# Patient Record
Sex: Male | Born: 1950 | Race: Black or African American | Hispanic: Refuse to answer | Marital: Married | Smoking: Current some day smoker
Health system: Southern US, Community
[De-identification: ages and names within clinical notes are randomized; demographics above are authoritative.]

## PROBLEM LIST (undated history)

## (undated) DIAGNOSIS — E785 Hyperlipidemia, unspecified: Secondary | ICD-10-CM

## (undated) DIAGNOSIS — G5600 Carpal tunnel syndrome, unspecified upper limb: Secondary | ICD-10-CM

## (undated) HISTORY — DX: Hyperlipidemia, unspecified: E78.5

## (undated) HISTORY — DX: Carpal tunnel syndrome, unspecified upper limb: G56.00

## (undated) HISTORY — PX: OTHER SURGICAL HISTORY: SHX169

## (undated) HISTORY — PX: TONSILLECTOMY: SUR1361

---

## 2009-08-18 ENCOUNTER — Encounter: Admission: RE | Admit: 2009-08-18 | Discharge: 2009-08-18 | Payer: Self-pay | Admitting: Neurosurgery

## 2009-09-05 ENCOUNTER — Encounter: Admission: RE | Admit: 2009-09-05 | Discharge: 2009-09-05 | Payer: Self-pay | Admitting: Neurosurgery

## 2010-02-03 ENCOUNTER — Encounter: Admission: RE | Admit: 2010-02-03 | Discharge: 2010-02-03 | Payer: Self-pay | Admitting: Orthopedic Surgery

## 2010-04-01 ENCOUNTER — Ambulatory Visit
Admission: RE | Admit: 2010-04-01 | Discharge: 2010-04-01 | Payer: Self-pay | Source: Home / Self Care | Attending: Orthopedic Surgery | Admitting: Orthopedic Surgery

## 2010-05-04 ENCOUNTER — Encounter: Payer: Self-pay | Admitting: Neurosurgery

## 2010-06-23 LAB — POCT HEMOGLOBIN-HEMACUE: Hemoglobin: 13.2 g/dL (ref 13.0–17.0)

## 2010-07-09 ENCOUNTER — Other Ambulatory Visit: Payer: Self-pay | Admitting: Orthopedic Surgery

## 2010-07-09 DIAGNOSIS — M25512 Pain in left shoulder: Secondary | ICD-10-CM

## 2010-07-14 ENCOUNTER — Ambulatory Visit
Admission: RE | Admit: 2010-07-14 | Discharge: 2010-07-14 | Disposition: A | Discharge status: Home / Self Care | Payer: BC Managed Care – PPO | Source: Ambulatory Visit | Attending: Orthopedic Surgery | Admitting: Orthopedic Surgery

## 2010-07-14 DIAGNOSIS — M25512 Pain in left shoulder: Secondary | ICD-10-CM

## 2011-03-16 ENCOUNTER — Ambulatory Visit
Admission: RE | Admit: 2011-03-16 | Discharge: 2011-03-16 | Disposition: A | Discharge status: Home / Self Care | Payer: BC Managed Care – PPO | Source: Ambulatory Visit | Attending: Family Medicine | Admitting: Family Medicine

## 2011-03-16 ENCOUNTER — Other Ambulatory Visit: Payer: Self-pay | Admitting: Family Medicine

## 2011-03-16 DIAGNOSIS — R1011 Right upper quadrant pain: Secondary | ICD-10-CM

## 2013-01-17 IMAGING — US US ABDOMEN COMPLETE
1 series · 14 of 25 positions shown · non-contrast
Comparison: None.

CLINICAL DATA: Right upper quadrant abdominal pain

COMPLETE ABDOMINAL ULTRASOUND

[Series 1: us abdomen complete · 14 of 91 slices shown]
[im 1/91]
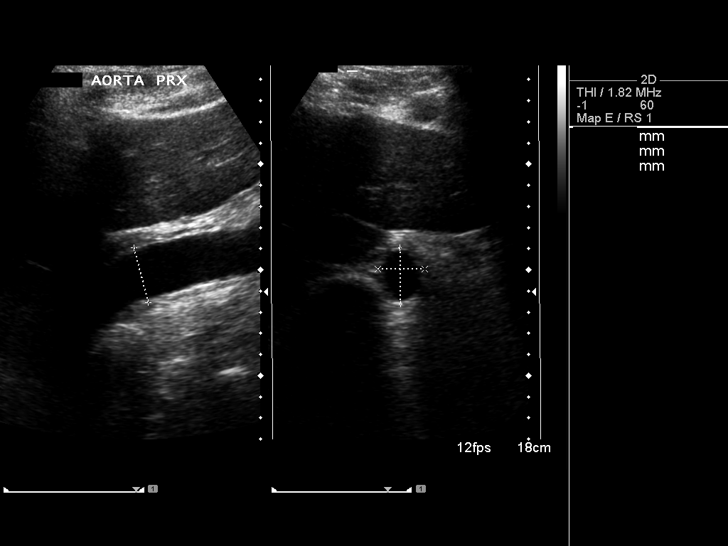
[im 8/91]
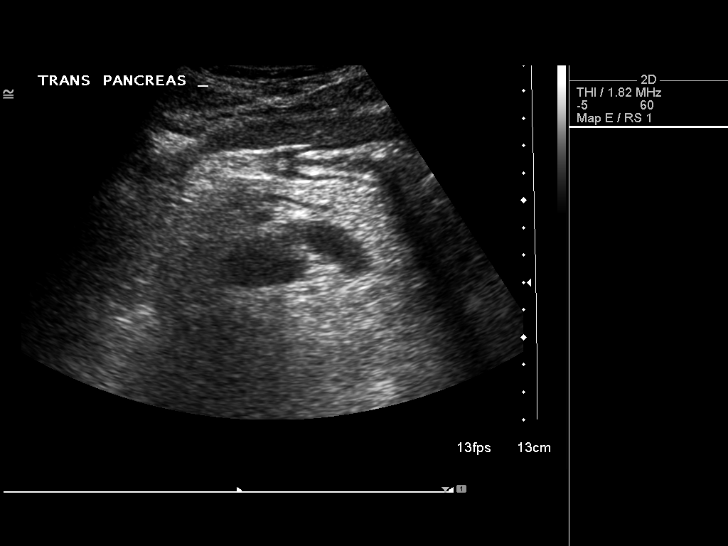
[im 16/91]
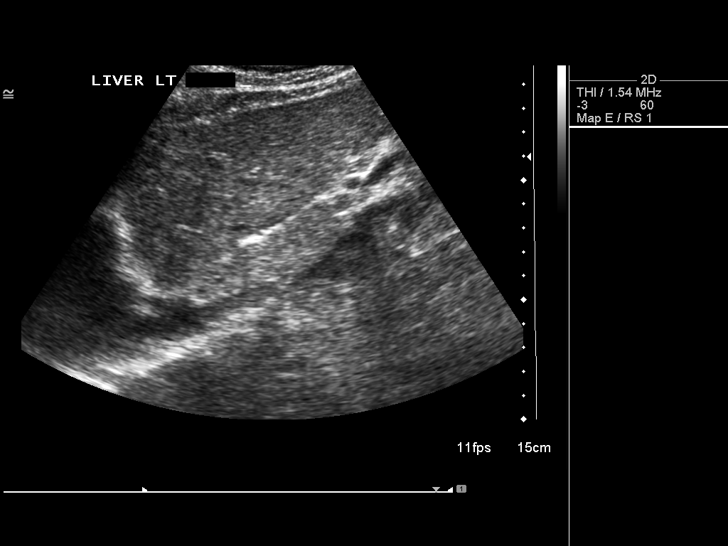
[im 23/91]
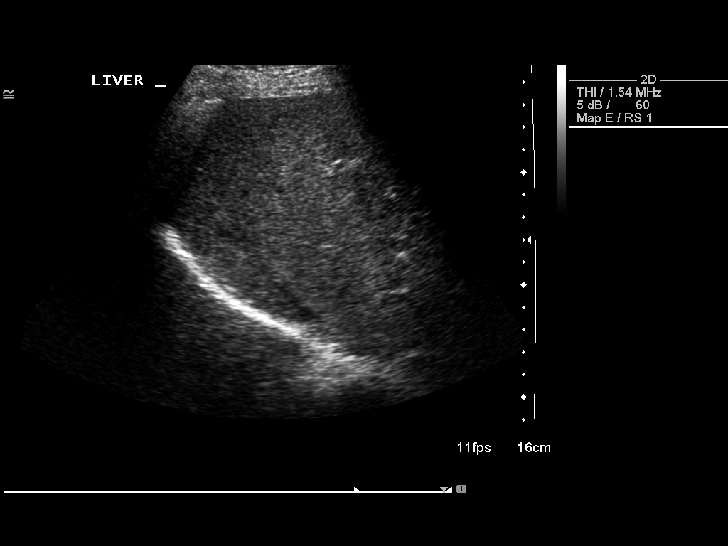
[im 31/91]
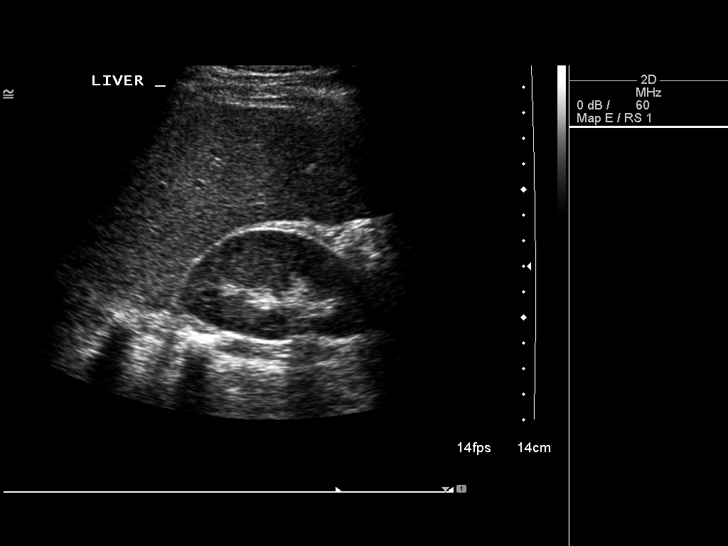
[im 34/91]
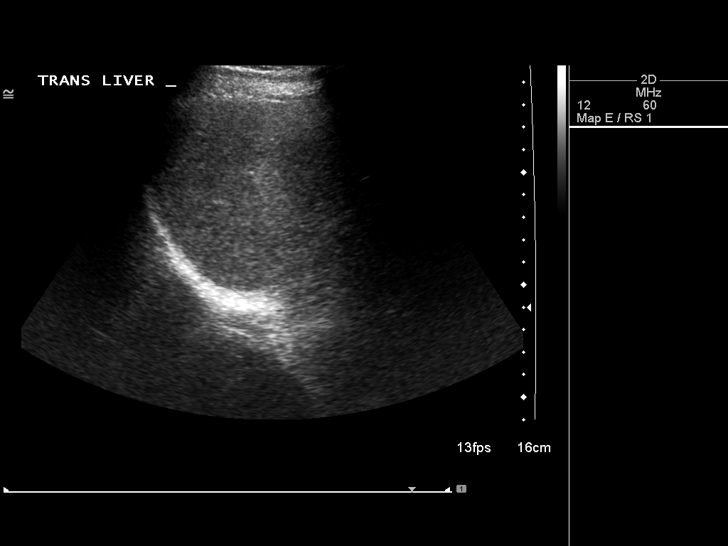
[im 42/91]
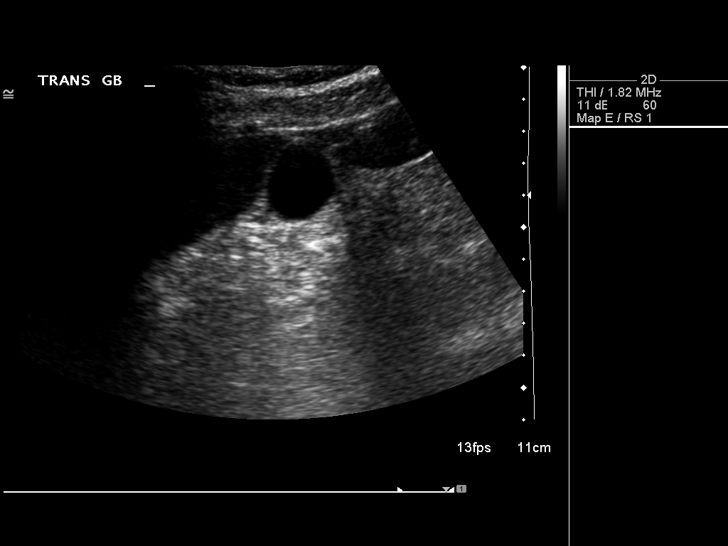
[im 49/91]
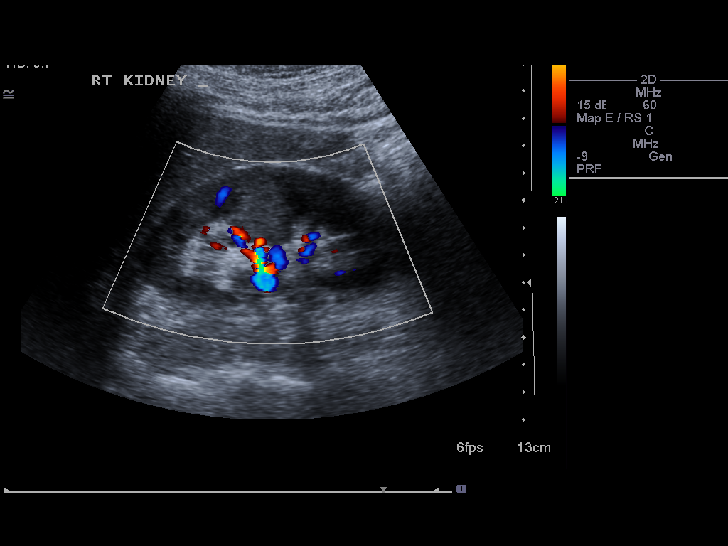
[im 57/91]
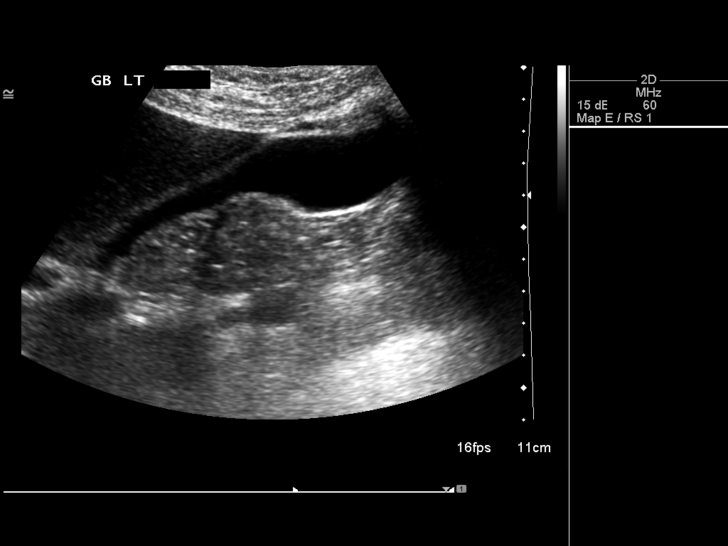
[im 61/91]
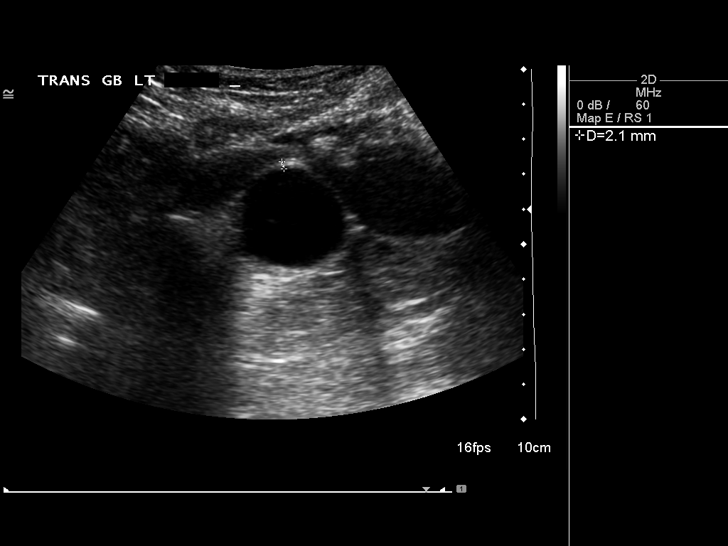
[im 68/91]
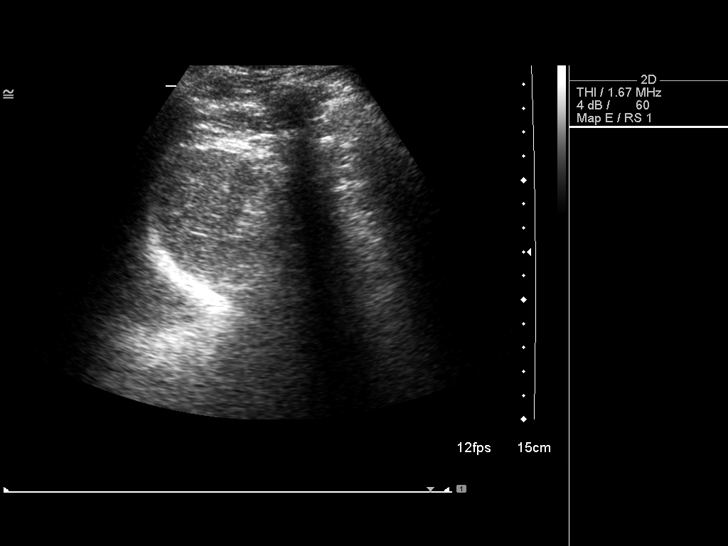
[im 76/91]
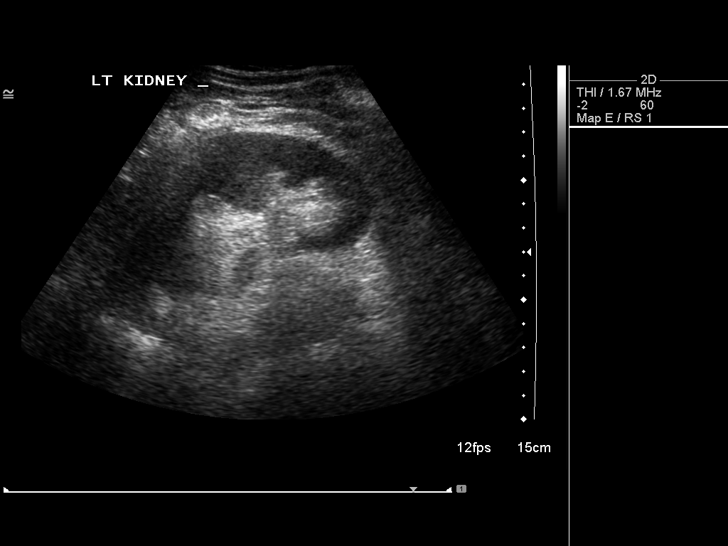
[im 83/91]
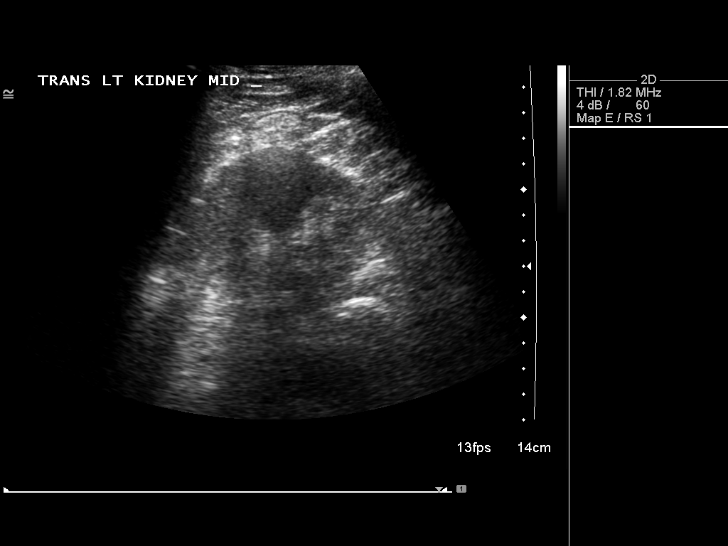
[im 91/91]
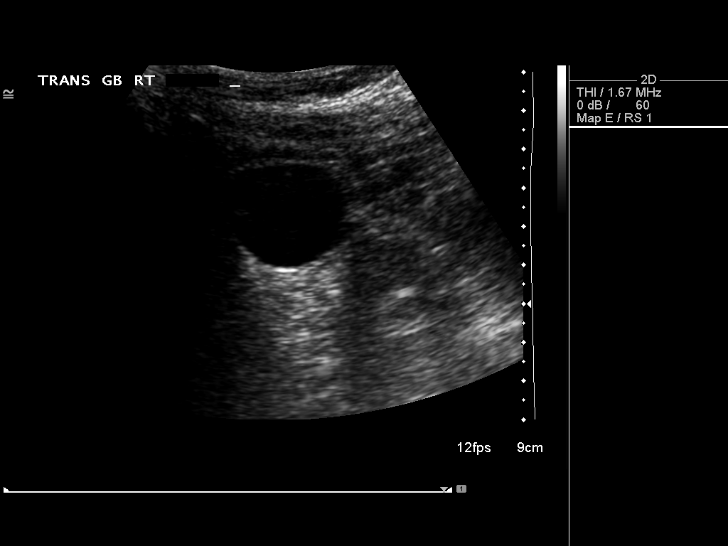

[14 of 25 positions shown; findings below may reference images not displayed]

FINDINGS: Gallbladder:  The gallbladder is visualized and no gallstones are
noted.  There is no pain over the gallbladder with compression.

Common bile duct:  The common bile duct is normal measuring 21.9 mm
in diameter.

Liver:  The liver is slightly prominent measuring 17 cm sagittally.
No hepatic lesion is seen.

IVC:  Appears normal.

Pancreas:  Portions of the head and tail of the pancreas are
obscured by bowel gas.

Spleen:  The spleen is normal measuring 4.4 cm sagittally.

Right Kidney:  No hydronephrosis is seen.  The right kidney
measures 9.7 cm sagittally.

Left Kidney:  No hydronephrosis.  The left kidney measures 9.5 cm.

Abdominal aorta:  The abdominal aorta is normal in caliber with
atheromatous change present.
IMPRESSION: 1.  No gallstones.  No ductal dilatation.
2.  Mild hepatomegaly.
3.  Portions of the head and tail of the pancreas are obscured.

## 2016-06-15 ENCOUNTER — Ambulatory Visit (INDEPENDENT_AMBULATORY_CARE_PROVIDER_SITE_OTHER): Payer: Commercial Managed Care - HMO | Admitting: Orthopaedic Surgery

## 2016-06-15 ENCOUNTER — Ambulatory Visit (INDEPENDENT_AMBULATORY_CARE_PROVIDER_SITE_OTHER): Payer: Commercial Managed Care - HMO

## 2016-06-15 ENCOUNTER — Encounter (INDEPENDENT_AMBULATORY_CARE_PROVIDER_SITE_OTHER): Payer: Self-pay | Admitting: Orthopaedic Surgery

## 2016-06-15 ENCOUNTER — Encounter (INDEPENDENT_AMBULATORY_CARE_PROVIDER_SITE_OTHER): Payer: Self-pay

## 2016-06-15 DIAGNOSIS — G8929 Other chronic pain: Secondary | ICD-10-CM

## 2016-06-15 DIAGNOSIS — M25561 Pain in right knee: Secondary | ICD-10-CM

## 2016-06-15 DIAGNOSIS — M1711 Unilateral primary osteoarthritis, right knee: Secondary | ICD-10-CM | POA: Diagnosis not present
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
## 2016-06-17 ENCOUNTER — Telehealth (INDEPENDENT_AMBULATORY_CARE_PROVIDER_SITE_OTHER): Payer: Self-pay | Admitting: *Deleted

## 2016-06-17 NOTE — Telephone Encounter (Signed)
Tried to call [facility] back but they are closed for lunch from 1-2 will try again later.

## 2016-06-17 NOTE — Telephone Encounter (Signed)
Ingrid calling from [facility] asking if a notes fax was received. CB:[phone]

## 2016-06-18 NOTE — Telephone Encounter (Signed)
Spoke to [facility] and I advised her not sure which [facility]. She said she will send a message to Jerene Dillingngrid to see what fax was faxed over to us. I have not received anything.

## 2017-02-02 ENCOUNTER — Encounter (INDEPENDENT_AMBULATORY_CARE_PROVIDER_SITE_OTHER): Payer: Self-pay | Admitting: Orthopaedic Surgery

## 2017-02-02 ENCOUNTER — Ambulatory Visit (INDEPENDENT_AMBULATORY_CARE_PROVIDER_SITE_OTHER): Payer: Commercial Managed Care - HMO | Admitting: Orthopaedic Surgery

## 2017-02-02 DIAGNOSIS — M1711 Unilateral primary osteoarthritis, right knee: Secondary | ICD-10-CM | POA: Diagnosis not present

## 2017-02-02 NOTE — Progress Notes (Signed)
   Office Visit Note   Patient: Nicholas Bailey           Date of Birth: 05/06/1950           MRN: [id number] Visit Date: 02/02/2017              Requested by: Fleet ContrasAvbuere, Edwin, MD [address] [place], [place] [id number] PCP: Fleet ContrasAvbuere, Edwin, MD   Assessment & Plan: Visit Diagnoses:  1. Primary osteoarthritis of right knee     Plan: Patient has advanced lateral compartment degenerative joint disease status post total patellectomy From a previous injury in the 1970s. at this point we will submit preapproval for Monovisc injection.  Follow-up for the injection.  Follow-Up Instructions: Return if symptoms worsen or fail to improve.   Orders:  No orders of the defined types were placed in this encounter.  No orders of the defined types were placed in this encounter.     Procedures: No procedures performed   Clinical Data: No additional findings.   Subjective: Chief Complaint  Patient presents with  . Right Knee - Pain    Patient follows up today for his right knee osteoarthritis.  He had a temporary and partial relief from previous cortisone injection.  He states that he continues to have throbbing pain that is worse at night.    Review of Systems  Constitutional: Negative.   All other systems reviewed and are negative.    Objective: Vital Signs: There were no vitals taken for this visit.  Physical Exam  Constitutional: He is oriented to person, place, and time. He appears well-developed and well-nourished.  Pulmonary/Chest: Effort normal.  Abdominal: Soft.  Neurological: He is alert and oriented to person, place, and time.  Skin: Skin is warm.  Psychiatric: He has a normal mood and affect. His behavior is normal. Judgment and thought content normal.  Nursing note and vitals reviewed.   Ortho Exam Right knee exam shows fully healed surgical scar.  Exam is stable.  He has good extensor function patient has Specialty Comments:  No specialty comments  available.  Imaging: No results found.   PMFS History: Patient Active Problem List   Diagnosis Date Noted  . Primary osteoarthritis of right knee 06/15/2016   No past medical history on file.  No family history on file.  No past surgical history on file. Social History   Occupational History  . Not on file.   Social History Main Topics  . Smoking status: Former Games developermoker  . Smokeless tobacco: Never Used  . Alcohol use Not on file  . Drug use: Unknown  . Sexual activity: Not on file

## 2017-02-09 ENCOUNTER — Telehealth (INDEPENDENT_AMBULATORY_CARE_PROVIDER_SITE_OTHER): Payer: Self-pay | Admitting: Orthopaedic Surgery

## 2017-02-09 NOTE — Telephone Encounter (Signed)
Patient called asking about the out of pocket expenses for his possible surgery. CB # [phone]

## 2017-02-16 ENCOUNTER — Telehealth (INDEPENDENT_AMBULATORY_CARE_PROVIDER_SITE_OTHER): Payer: Self-pay | Admitting: Radiology

## 2017-02-16 NOTE — Telephone Encounter (Signed)
Per Morrison Oldisha  Patient Nicholas Bailey which seen Dr. Roda ShuttersXu on 02/02/17 is questioning the out of pocket for inj procedure but, wasnt sure what inj for other then knee.  Has Medicare [employer] insurance DOB 08/31/1950. [id number] Cell ph# [phone] said wanting a call back about this had called last Thursday and left message with [facility] about.  Thx Tisha

## 2017-02-16 NOTE — Telephone Encounter (Signed)
Emailed from [facility], can you please call patient to advise on the Monovisc?   Chipper OmanZawatski, Tisha [10:11 AM]:  Merla Ric[facility]Ladies  [facility] call today [facility] regarding a return call from his appt., 02/02/17 with Dr. Roda ShuttersXu wanting to know what the out of pocket is going to be for knee injection. His Cell is [phone] which he can be reach at today. DOB 11/01/1950 has Medicare [employer] insurance and acct [id number].Thx Tisha

## 2017-02-17 NOTE — Telephone Encounter (Signed)
Checked portal and something went wrong needed more info which I submitted will wait for fax copy to get back

## 2017-02-19 NOTE — Telephone Encounter (Signed)
Patient called again to check on the out of pocket max for inj.

## 2017-02-22 NOTE — Telephone Encounter (Signed)
Called to let him know this is still pending. I requested medical benefits (monovisc) and got the fax today. Showed Toniann FailWendy and she will follow up on this.

## 2017-03-02 ENCOUNTER — Telehealth (INDEPENDENT_AMBULATORY_CARE_PROVIDER_SITE_OTHER): Payer: Self-pay | Admitting: Orthopaedic Surgery

## 2017-03-02 NOTE — Telephone Encounter (Signed)
error 

## 2017-03-02 NOTE — Telephone Encounter (Signed)
Patient checking on status of his insurances out of pocket max for knee injection.

## 2017-03-02 NOTE — Telephone Encounter (Signed)
Remember Leonette MonarchGaston emailed us about patient are we going to get something Via fax to have or just go ahead and give patient inj?

## 2017-03-03 NOTE — Telephone Encounter (Signed)
Buelah ManisLetrice was supposed to fax the benefits to us, I have emailed her and asked her to email them to me instead.  You should have received them by now.  They will not reflect on the web since it was manually checked.  I will let you know if she emails them to me.

## 2017-03-11 ENCOUNTER — Encounter (INDEPENDENT_AMBULATORY_CARE_PROVIDER_SITE_OTHER): Payer: Self-pay | Admitting: Orthopaedic Surgery

## 2017-03-11 ENCOUNTER — Ambulatory Visit (INDEPENDENT_AMBULATORY_CARE_PROVIDER_SITE_OTHER): Payer: Commercial Managed Care - HMO | Admitting: Orthopaedic Surgery

## 2017-03-11 DIAGNOSIS — M1711 Unilateral primary osteoarthritis, right knee: Secondary | ICD-10-CM

## 2017-03-11 MED ORDER — HYALURONAN 88 MG/4ML IX SOSY
88.0000 mg | PREFILLED_SYRINGE | INTRA_ARTICULAR | Status: AC | PRN
  Administered 2017-03-11: 88 mg via INTRA_ARTICULAR

## 2017-03-11 NOTE — Progress Notes (Signed)
   Procedure Note  Patient: Nicholas RooseveltGarland Derousse             Date of Birth: 04/07/1951           MRN: [id number]             Visit Date: 03/11/2017  Procedures: Visit Diagnoses: Primary osteoarthritis of right knee  Large Joint Inj: R knee on 03/11/2017 8:12 AM Indications: pain Details: 22 G needle  Arthrogram: No  Medications: 88 mg Hyaluronan 88 MG/4ML Outcome: tolerated well, no immediate complications Patient was prepped and draped in the usual sterile fashion.

## 2017-04-15 ENCOUNTER — Telehealth (INDEPENDENT_AMBULATORY_CARE_PROVIDER_SITE_OTHER): Payer: Self-pay | Admitting: Orthopaedic Surgery

## 2017-04-15 NOTE — Telephone Encounter (Signed)
[facility] checking on records request. I advised was faxed 12/7. She stated she did not have them. I re faxed [phone]

## 2018-08-17 ENCOUNTER — Telehealth: Payer: Self-pay | Admitting: Orthopaedic Surgery

## 2018-08-17 NOTE — Telephone Encounter (Signed)
Patient called needing to have the gel injection in his right knee. The number to contact patient is [phone]

## 2018-08-17 NOTE — Telephone Encounter (Signed)
Please submit for gel inj. Thank you.

## 2018-08-17 NOTE — Telephone Encounter (Signed)
Noted  

## 2018-08-23 ENCOUNTER — Telehealth: Payer: Self-pay

## 2018-08-23 NOTE — Telephone Encounter (Signed)
Submitted VOB for Monovisc, right knee. 

## 2018-08-29 ENCOUNTER — Telehealth: Payer: Self-pay

## 2018-08-29 NOTE — Telephone Encounter (Signed)
Talked with patient and advised him that he is approved for gel injection.  Patient is approved for Monovisc, right knee. Buy & Bill Patient will be responsible for 20% of the allowable amount. Co-pay of $45.00 (required) No PA required  Appt.08/31/2018 with Dr. Roda Shutters.

## 2018-08-31 ENCOUNTER — Encounter (INDEPENDENT_AMBULATORY_CARE_PROVIDER_SITE_OTHER): Payer: Self-pay

## 2018-08-31 ENCOUNTER — Ambulatory Visit: Payer: Medicare HMO | Admitting: Orthopaedic Surgery

## 2018-08-31 ENCOUNTER — Other Ambulatory Visit: Payer: Self-pay

## 2018-08-31 ENCOUNTER — Ambulatory Visit (INDEPENDENT_AMBULATORY_CARE_PROVIDER_SITE_OTHER): Payer: Medicare HMO

## 2018-08-31 ENCOUNTER — Encounter: Payer: Self-pay | Admitting: Orthopaedic Surgery

## 2018-08-31 DIAGNOSIS — M1711 Unilateral primary osteoarthritis, right knee: Secondary | ICD-10-CM | POA: Diagnosis not present

## 2018-08-31 MED ORDER — BUPIVACAINE HCL 0.25 % IJ SOLN
2.0000 mL | INTRAMUSCULAR | Status: AC | PRN
  Administered 2018-08-31: 2 mL via INTRA_ARTICULAR

## 2018-08-31 MED ORDER — LIDOCAINE HCL 1 % IJ SOLN
2.0000 mL | INTRAMUSCULAR | Status: AC | PRN
  Administered 2018-08-31: 2 mL

## 2018-08-31 MED ORDER — HYALURONAN 88 MG/4ML IX SOSY
88.0000 mg | PREFILLED_SYRINGE | INTRA_ARTICULAR | Status: AC | PRN
  Administered 2018-08-31: 88 mg via INTRA_ARTICULAR
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
## 2018-09-11 ENCOUNTER — Telehealth: Payer: Self-pay | Admitting: Cardiology

## 2018-09-11 ENCOUNTER — Other Ambulatory Visit: Payer: Medicare HMO

## 2018-09-11 DIAGNOSIS — Z20822 Contact with and (suspected) exposure to covid-19: Secondary | ICD-10-CM

## 2018-09-11 NOTE — Telephone Encounter (Signed)
Spoke to patient regarding recent office appt 5/20 and possible Covid exposure.  Patient has no symptoms but would like to be tested.  Covid order placed and appt Sunday 5/31 @ 1:00.

## 2018-09-12 LAB — NOVEL CORONAVIRUS, NAA: SARS-CoV-2, NAA: NOT DETECTED

## 2020-01-09 ENCOUNTER — Ambulatory Visit: Payer: Medicare HMO | Attending: Internal Medicine

## 2020-01-09 DIAGNOSIS — Z23 Encounter for immunization: Secondary | ICD-10-CM

## 2020-01-09 NOTE — Progress Notes (Signed)
   Covid-19 Vaccination Clinic  Name:  Glover Capano    MRN: [id number] DOB: 1951/04/13  01/09/2020  Mr. Mullenbach was observed post Covid-19 immunization for 15 minutes without incident. He was provided with Vaccine Information Sheet and instruction to access the V-Safe system.   Mr. Sandy was instructed to call 911 with any severe reactions post vaccine: Marland Kitchen Difficulty breathing  . Swelling of face and throat  . A fast heartbeat  . A bad rash all over body  . Dizziness and weakness

## 2020-06-11 ENCOUNTER — Ambulatory Visit: Payer: Medicare HMO | Admitting: Urology

## 2020-06-11 ENCOUNTER — Encounter: Payer: Self-pay | Admitting: Urology

## 2020-06-11 ENCOUNTER — Other Ambulatory Visit: Payer: Self-pay

## 2020-06-11 VITALS — BP 120/85 | HR 99 | Ht 76.0 in | Wt 178.0 lb

## 2020-06-11 DIAGNOSIS — N486 Induration penis plastica: Secondary | ICD-10-CM

## 2020-06-11 DIAGNOSIS — N5203 Combined arterial insufficiency and corporo-venous occlusive erectile dysfunction: Secondary | ICD-10-CM | POA: Diagnosis not present

## 2020-06-11 NOTE — Progress Notes (Signed)
06/11/2020 3:46 PM   Nicholas Bailey [birth year] [id number]  Referring provider: Crista Elliot, MD [address] [place],  [place] [address]  Chief Complaint  Patient presents with  . Abnormal Penile Curvature    HPI: 70 year old male who presents today for further evaluation of Peyronie's disease.  He was recently seen and evaluated at [facility] urology by Dr. Alvester Morin.  He is referred here due to insurance reasons.  He reports that about 18 months ago, he had a traumatic encounter during intercourse which was painful.  He denied swelling or bruising after the intercourse but he had some soreness for a few days.  After that, he started to notice some upward penile curvature.  This progressed but is stabilized over the last 3 months.  Is now about 45 degrees superior.  This has led to difficulty with penetration and discomfort.  He like to address this.  Options including surgical intervention versus Xiaflex were reviewed and he like to pursue Xiaflex.  He does have some mild baseline erectile dysfunction.  He sometimes uses Viagra with good result.   PMH: History reviewed. No pertinent past medical history.  Surgical History: History reviewed. No pertinent surgical history.  Home Medications:  Allergies as of 06/11/2020   No Known Allergies     Medication List       Accurate as of June 11, 2020  3:46 PM. If you have any questions, ask your nurse or doctor.        benzonatate 100 MG capsule Commonly known as: TESSALON   hydrOXYzine 25 MG tablet Commonly known as: ATARAX/VISTARIL   Naproxen DR 500 MG EC tablet Generic drug: naproxen   simvastatin 40 MG tablet Commonly known as: ZOCOR       Allergies: No Known Allergies  Family History: History reviewed. No pertinent family history.  Social History:  reports that he has quit smoking. He has never used smokeless tobacco. No history on file for alcohol use and drug use.   Physical Exam: BP 120/85    Pulse 99   Ht 6\' 4"  (1.93 m)   Wt 178 lb (80.7 kg)   BMI 21.67 kg/m   Constitutional:  Alert and oriented, No acute distress. HEENT: [place] AT, moist mucus membranes.  Trachea midline, no masses. Cardiovascular: No clubbing, cyanosis, or edema. Respiratory: Normal respiratory effort, no increased work of breathing. GI: Abdomen is soft, nontender, nondistended, no abdominal masses GU: Proximal and 1.5 cm dorsal midshaft penile plaque, nontender, midline. Skin: No rashes, bruises or suspicious lesions. Neurologic: Grossly intact, no focal deficits, moving all 4 extremities. Psychiatric: Normal mood and affect.  Assessment & Plan:    1. 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  Pathophysiology was discussed at length including the 2 phases of the diease, acute and chronic.  Treatment options and goals of treatment were discussed today in detail. Options including observation, penile plaque and graft, penile plication, placement of penile prosthesis, and injection of collagenase were all reviewed.  An benefits of each were discussed at length.  He seems to be most interested in collagenase injections with the medication Xiaflex.  We discussed that this medication is administered in cycles which include 2 injections of the medication into the penile plaque followed by modeling procedure with 6 weeks of home exercises. Goals of treatment were reviewed which include improvement of penile curvature ideally to facilitate sexual function/penetration but likely not complete resolution of the curvature. Risks  including failure of the medication, penile hematoma/edema, pain, and penile fracture were all discussed. All of his questions were answered.  We would like to  schedule cycle this medication and we will arrange for insurance investigation. He will need an induction of an erection with measurement as part of this evaluation.  2.  Combined arterial insufficiency and corporo-venous occlusive erectile dysfunction Responsive to PDE 5 inhibitors, continue as needed    Vanna Scotland, MD  [facility] [address] [place], [place] [address] [phone]

## 2020-06-25 ENCOUNTER — Telehealth: Payer: Self-pay | Admitting: *Deleted

## 2020-06-25 NOTE — Telephone Encounter (Signed)
Patient was calling to see what is going on with the insurance for Xiaflex.

## 2020-06-25 NOTE — Telephone Encounter (Signed)
Benefit investigation form was received on 06-13-20 and indicates no PA required.  Called [facility] to check status with [facility], spoke w/Brittany. [place] w/[facility] states that patient is not in their system. It appears that the script was not forwarded from [facility] to [facility]. [place] will reach out to [employer] and have the form sent to them so that they can work on getting this filled for the patient. She will call back with an update. Patient was notified of this and was told that [facility] will call them to set up payment arrangements prior to shipping medication.

## 2020-06-26 NOTE — Telephone Encounter (Signed)
Update received from [employer], starting investigation of coverage and will contact patient with update

## 2020-06-28 ENCOUNTER — Telehealth: Payer: Self-pay

## 2020-06-28 NOTE — Telephone Encounter (Signed)
Incoming call from [facility] stating that they are in need of clinicals in order to process pt's prior auth. Last office note faxed to [phone].

## 2020-07-02 NOTE — Telephone Encounter (Signed)
Additional clinical notes requested and faxed to [employer] [facility] [phone])

## 2020-07-05 NOTE — Telephone Encounter (Signed)
Update from [employer] [facility] stating that they are waiting on PA status from [facility]. Contacted patient and updated him on the status, medication is pending PA approval

## 2020-07-11 NOTE — Telephone Encounter (Signed)
[employer] updated fax communication states PA is approved. Patient was contacted and also approved for patient assistance grant (PAP) Patient has given them consent for delivery. Delivery was scheduled for Tuesday 07-16-2020. Once medication is received will contact patient to schedule appointments. Patient was notified of this and verbalized understanding

## 2020-07-18 NOTE — Telephone Encounter (Signed)
[facility] called stating that there was an issue with delivery of medication, new delivery date of Tuesday 07-23-20

## 2020-07-18 NOTE — Telephone Encounter (Signed)
Patient notified

## 2020-07-23 NOTE — Telephone Encounter (Signed)
Medication received, contacted patient and scheduled for injection appointments

## 2020-08-04 NOTE — Progress Notes (Signed)
Procedure: Xiaflex injection   Previously marked penile plaque site Remained visible with marking pen. The plaque was easily palpable on the midline proximal dorsal aspect of phallus. Xiaflex injection (0.58 mg) was then injected directly into the plaque at the point of maximal curvature at an angle after to being warm to room temperature using a small TB needle. He tolerated the procedure very well.    Ashley Brandon, MD 

## 2020-08-06 ENCOUNTER — Ambulatory Visit: Payer: Self-pay | Admitting: Urology

## 2020-08-06 DIAGNOSIS — M109 Gout, unspecified: Secondary | ICD-10-CM | POA: Insufficient documentation

## 2020-08-06 NOTE — Progress Notes (Signed)
Error

## 2020-08-07 ENCOUNTER — Other Ambulatory Visit: Payer: Self-pay

## 2020-08-07 ENCOUNTER — Encounter: Payer: Self-pay | Admitting: Urology

## 2020-08-07 ENCOUNTER — Ambulatory Visit (INDEPENDENT_AMBULATORY_CARE_PROVIDER_SITE_OTHER): Payer: Medicare HMO | Admitting: Urology

## 2020-08-07 ENCOUNTER — Ambulatory Visit: Payer: Medicare HMO | Admitting: Urology

## 2020-08-07 VITALS — BP 127/85 | HR 80 | Wt 178.0 lb

## 2020-08-07 VITALS — HR 92 | Ht 76.0 in | Wt 178.0 lb

## 2020-08-07 DIAGNOSIS — N486 Induration penis plastica: Secondary | ICD-10-CM

## 2020-08-07 DIAGNOSIS — N5203 Combined arterial insufficiency and corporo-venous occlusive erectile dysfunction: Secondary | ICD-10-CM

## 2020-08-07 MED ORDER — COLLAGENASE CLOSTRID HISTOLYT 0.9 MG IJ SOLR
0.9000 mg | Freq: Once | INTRAMUSCULAR | Status: AC
  Administered 2020-08-07: 0.9 mg via INTRAMUSCULAR

## 2020-08-07 NOTE — Progress Notes (Signed)
08/07/2020 8:38 AM   Nicholas Bailey Jun 24, 1950 [id number]  Referring provider: Fleet Contras, MD [address] [place],  [place] [address]  Chief Complaint  Patient presents with  . Other   Urological history: 1. ED -contributing factors of age, former smoker, HTN and HLD -managed with sildenafil  2. 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 male who presents today for induction for Xiaflex injections.   PMH: Past Medical History:  Diagnosis Date  . Carpal tunnel syndrome   . Hyperlipidemia     Surgical History: Past Surgical History:  Procedure Laterality Date  . left shoulder surgery    . right knee surgery      Home Medications:  Allergies as of 08/07/2020   No Known Allergies     Medication List       Accurate as of August 07, 2020  8:38 AM. If you have any questions, ask your nurse or doctor.        benzonatate 100 MG capsule Commonly known as: TESSALON   hydrOXYzine 25 MG tablet Commonly known as: ATARAX/VISTARIL   Naproxen DR 500 MG EC tablet Generic drug: naproxen   simvastatin 40 MG tablet Commonly known as: ZOCOR       Allergies: No Known Allergies  Family History: Family History  Problem Relation Age of Onset  . Heart attack Father   . Heart disease Brother   . Alzheimer's disease Mother   . Bladder Cancer Neg Hx   . Prostate cancer Neg Hx   . Testicular cancer Neg Hx   . Kidney cancer Neg Hx     Social History:  reports that he has quit smoking. He has never used smokeless tobacco. No history on file for alcohol use and drug use.  ROS: Pertinent ROS in HPI  Physical Exam: Pulse 92   Ht 6\' 4"  (1.93 m)   Wt 178 lb (80.7 kg)   BMI 21.67 kg/m   Constitutional:  Well nourished. Alert and oriented, No acute distress. HEENT: [place] AT, mask in place.  Trachea midline Cardiovascular: No clubbing, cyanosis, or  edema. Respiratory: Normal respiratory effort, no increased work of breathing. GU: No CVA tenderness.  No bladder fullness or masses.  Patient with uncircumcised phallus.  Foreskin easily retracted  Urethral meatus is patent.  No penile discharge. No penile lesions or rashes.  Penis curves to the right and a Peyronie's plaque is palpated in the midshaft of the penis. Scrotum without lesions, cysts, rashes and/or edema.   Neurologic: Grossly intact, no focal deficits, moving all 4 extremities. Psychiatric: Normal mood and affect.  Laboratory Data: Lab Results  Component Value Date   HGB 13.2 04/01/2010  I have reviewed the labs.    Procedure  Patient's left corpus cavernosum is identified.  An area near the base of the penis is cleansed with rubbing alcohol.  Careful to avoid the dorsal vein, 25 mcg of Edex 10 mcg Lot # 04/03/2010 exp # 11/2020 is injected at a 90 degree angle into the right corpus cavernosum near the base of the penis.  Patient experienced a semi firm erection in 15 minutes.            A 50 degree curvature of the penis is appreciated with the glans pointing to the left that result of a Peyronie's plaque located in the midshaft in the left corpora (4 cm x 1 cm)  Assessment &  Plan:    1. ED -continue sildenafil  2. Peyronie's disease -erection induced and marking completed -return this afternoon for Xiaflex injections   These notes generated with voice recognition software. I apologize for typographical errors.  Michiel Cowboy, PA-C  [facility] [address]  [address] [place], [place] [address] [phone]

## 2020-08-08 ENCOUNTER — Ambulatory Visit (INDEPENDENT_AMBULATORY_CARE_PROVIDER_SITE_OTHER): Payer: Medicare HMO | Admitting: Urology

## 2020-08-08 ENCOUNTER — Encounter: Payer: Self-pay | Admitting: Urology

## 2020-08-08 ENCOUNTER — Ambulatory Visit: Payer: Self-pay | Admitting: Urology

## 2020-08-08 VITALS — BP 138/83 | HR 97

## 2020-08-08 DIAGNOSIS — N486 Induration penis plastica: Secondary | ICD-10-CM | POA: Diagnosis not present

## 2020-08-08 MED ORDER — COLLAGENASE CLOSTRID HISTOLYT 0.9 MG IJ SOLR
0.9000 mg | Freq: Once | INTRAMUSCULAR | Status: AC
  Administered 2020-08-08: 0.9 mg via INTRAMUSCULAR

## 2020-08-08 NOTE — Progress Notes (Signed)
Procedure: Xiaflex injection   Previously marked penile plaque site Remained visible with marking pen. The plaque was easily palpable on the midline proximal dorsal aspect of phallus. Xiaflex injection (0.58 mg) was then injected directly into the plaque at the point of maximal curvature at an angle after to being warm to room temperature using a small TB needle. He tolerated the procedure very well.    Vanna Scotland, MD

## 2020-08-08 NOTE — Progress Notes (Signed)
Xiaflex modeling  Patient's Peyronie's plaque is identified and palpated on the left of the phallus. Wearing gloves, I grasped the plaque about 1 cm proximal and distal to the injection site, making sure to avoid direct pressure on the injection site.  Using the target plaque as a fulcrum point, I used both hands to apply firm, steady pressure to elongate and stretch the plaque.  The penis was bent opposite to the patient's penile curvature, with stretching to the point of moderate resistance. I held pressure for 30 seconds then released. After a 60 second rest period, I repeated the penile modeling technique for a total of 3 modeling attempts at 30 seconds for each attempt.  We discussed penile strengthening and bending exercises at home for the next 6 weeks; instruction sheet provided to the patient. He tolerated well with no concerns.

## 2020-08-09 ENCOUNTER — Other Ambulatory Visit: Payer: Self-pay

## 2020-08-09 ENCOUNTER — Encounter: Payer: Self-pay | Admitting: Urology

## 2020-08-09 ENCOUNTER — Ambulatory Visit (INDEPENDENT_AMBULATORY_CARE_PROVIDER_SITE_OTHER): Payer: Medicare HMO | Admitting: Urology

## 2020-08-09 VITALS — BP 129/81 | HR 98 | Ht 76.0 in | Wt 178.0 lb

## 2020-08-09 DIAGNOSIS — N486 Induration penis plastica: Secondary | ICD-10-CM

## 2020-08-19 ENCOUNTER — Other Ambulatory Visit: Payer: Self-pay | Admitting: Urology

## 2020-10-02 ENCOUNTER — Other Ambulatory Visit: Payer: Self-pay

## 2020-10-02 ENCOUNTER — Ambulatory Visit (INDEPENDENT_AMBULATORY_CARE_PROVIDER_SITE_OTHER): Payer: Medicare HMO | Admitting: Urology

## 2020-10-02 ENCOUNTER — Encounter: Payer: Self-pay | Admitting: Urology

## 2020-10-02 VITALS — BP 129/81 | HR 96 | Ht 76.0 in | Wt 178.0 lb

## 2020-10-02 DIAGNOSIS — N486 Induration penis plastica: Secondary | ICD-10-CM | POA: Diagnosis not present

## 2020-10-02 NOTE — Progress Notes (Signed)
10/02/2020 11:16 AM   Nicholas Bailey Dec 24, 1950 [id number]  Referring provider: Fleet Contras, MD [address] [place],  [place] [address]  Chief Complaint  Patient presents with   Abnormal Penile Curvature    HPI: 70 year old male with Peyronie's disease who presents today 6 weeks status post his first cycle of Xiaflex.  He has a documented 50 degree curvature at the level of the midshaft to the left.  He reports that he did well with the first cycle.  He was very good about doing his exercises.  He is anxious to return to sexual activity.  He does report today that he was a little bit disappointed in the result.  He does notice some very mild improvement in his curvature, now approximately 40 to 45 degrees from his previous 50 but it still more than he would like.   PMH: Past Medical History:  Diagnosis Date   Carpal tunnel syndrome    Hyperlipidemia     Surgical History: Past Surgical History:  Procedure Laterality Date   left shoulder surgery     right knee surgery      Home Medications:  Allergies as of 10/02/2020   No Known Allergies      Medication List        Accurate as of October 02, 2020 11:16 AM. If you have any questions, ask your nurse or doctor.          fenofibrate 160 MG tablet   hydrOXYzine 25 MG tablet Commonly known as: ATARAX/VISTARIL   Naproxen DR 500 MG EC tablet Generic drug: naproxen   simvastatin 40 MG tablet Commonly known as: ZOCOR        Allergies: No Known Allergies  Family History: Family History  Problem Relation Age of Onset   Heart attack Father    Heart disease Brother    Alzheimer's disease Mother    Bladder Cancer Neg Hx    Prostate cancer Neg Hx    Testicular cancer Neg Hx    Kidney cancer Neg Hx     Social History:  reports that he has quit smoking. He has never used smokeless tobacco. No history on file for alcohol use and drug use.   Physical Exam: BP 129/81   Pulse 96   Ht 6\' 4"  (1.93  m)   Wt 178 lb (80.7 kg)   BMI 21.67 kg/m   Constitutional:  Alert and oriented, No acute distress. HEENT: [place] AT, moist mucus membranes.  Trachea midline, no masses. Cardiovascular: No clubbing, cyanosis, or edema. Respiratory: Normal respiratory effort, no increased work of breathing. Skin: No rashes, bruises or suspicious lesions. Neurologic: Grossly intact, no focal deficits, moving all 4 extremities. Psychiatric: Normal mood and affect.  Laboratory Data: Lab Results  Component Value Date   HGB 13.2 04/01/2010    Assessment & Plan:    1. Peyronie disease Mild clinical improvement in Peyronie's curvature, no 40 to 45 degrees from 50 per patient report  He is interested in pursuing another cycle.  We discussed that with each subsequent cycle, there can be progressive improvement although improvement is not guaranteed.  We discussed realistic goals today at length.  He is free to resume sexual activity at this point in time, is very low risk of penile fracture at this point per guidelines.  We will get him set up for another cycle of Xiaflex.   04/03/2010, MD  [facility] [address] [place], [place] [id number]

## 2020-10-09 ENCOUNTER — Other Ambulatory Visit: Payer: Self-pay

## 2020-10-09 MED ORDER — XIAFLEX 0.9 MG IJ SOLR: INTRAMUSCULAR | 0 refills | Status: DC

## 2020-10-10 ENCOUNTER — Telehealth: Payer: Self-pay

## 2020-10-10 NOTE — Telephone Encounter (Signed)
Incoming message on triage line from [employer] [facility] to schedule delivery of medication for this pt. Please call.

## 2020-10-15 NOTE — Telephone Encounter (Signed)
Spoke with Tina @[employer] and have confirmed delivery of medication for tomorrow. Once delivery of medication is received at office will contact patient to schedule injection appointment

## 2020-10-21 NOTE — Telephone Encounter (Signed)
Medication delivery confirmed. Contacted patient and scheduled him for appointments

## 2020-11-04 NOTE — Progress Notes (Signed)
11/05/2020 8:13 AM   Nicholas Bailey 02/21/1951 [id number]  Referring provider: Fleet Contras, MD [address] [place],  [place] [address]  Urological history: 1. ED -contributing factors of age, former smoker, HTN and HLD -managed with sildenafil  2. 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 male who presents today for induction for Xiaflex injections.   PMH: Past Medical History:  Diagnosis Date   Carpal tunnel syndrome    Hyperlipidemia     Surgical History: Past Surgical History:  Procedure Laterality Date   left shoulder surgery     right knee surgery      Home Medications:  Allergies as of 11/05/2020   No Known Allergies      Medication List        Accurate as of November 05, 2020  8:13 AM. If you have any questions, ask your nurse or doctor.          fenofibrate 160 MG tablet   hydrOXYzine 25 MG tablet Commonly known as: ATARAX/VISTARIL   Naproxen DR 500 MG EC tablet Generic drug: naproxen   simvastatin 40 MG tablet Commonly known as: ZOCOR   Xiaflex 0.9 MG Solr Generic drug: Collagenase Clostrid Histolyt Inject 0.58mg  into penile plaque 2 times, 1-3 days apart        Allergies: No Known Allergies  Family History: Family History  Problem Relation Age of Onset   Heart attack Father    Heart disease Brother    Alzheimer's disease Mother    Bladder Cancer Neg Hx    Prostate cancer Neg Hx    Testicular cancer Neg Hx    Kidney cancer Neg Hx     Social History:  reports that he has quit smoking. He has never used smokeless tobacco. No history on file for alcohol use and drug use.  ROS: Pertinent ROS in HPI  Physical Exam: BP 127/84   Pulse 82   Ht 6\' 4"  (1.93 m)   Wt 178 lb (80.7 kg)   BMI 21.67 kg/m    Constitutional:  Well nourished. Alert and oriented, No acute distress. HEENT: [place] AT, mask in place.  Trachea midline Cardiovascular: No clubbing, cyanosis, or edema. Respiratory: Normal respiratory effort, no increased work of breathing. GU: No CVA tenderness.  No bladder fullness or masses.  Patient with uncircumcised phallus. Foreskin easily retracted  Urethral meatus is patent.  No penile discharge.  Neurologic: Grossly intact, no focal deficits, moving all 4 extremities. Psychiatric: Normal mood and affect.   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  An area near the base of the penis is cleansed with rubbing alcohol.  Careful to avoid the dorsal vein, 25 mcg of Edex 10 mcg Lot # exp # 07/2021 is injected at a 90 degree angle into the left corpus cavernosum near the base of the penis.  Patient experienced a semi firm erection in 15 minutes.   A 40 degree curvature of the penis is appreciated with the glans pointing to the left that result of a Peyronie's plaque located in the midshaft in the left corpora (4 cm x 1 cm)       Assessment & Plan:    1. ED -continue sildenafil  2. Peyronie's disease -  erection induced and marking completed -return this afternoon for Xiaflex injections   These notes generated with voice recognition software. I apologize for typographical errors.  Michiel Cowboy, PA-C  [facility] [address]  [address] [place], [place] [address] [phone]

## 2020-11-05 ENCOUNTER — Encounter: Payer: Self-pay | Admitting: Urology

## 2020-11-05 ENCOUNTER — Ambulatory Visit (INDEPENDENT_AMBULATORY_CARE_PROVIDER_SITE_OTHER): Payer: Medicare HMO | Admitting: Urology

## 2020-11-05 ENCOUNTER — Other Ambulatory Visit: Payer: Self-pay

## 2020-11-05 VITALS — BP 127/84 | HR 82 | Ht 76.0 in | Wt 178.0 lb

## 2020-11-05 VITALS — BP 142/84 | HR 91 | Ht 76.0 in | Wt 178.0 lb

## 2020-11-05 DIAGNOSIS — N486 Induration penis plastica: Secondary | ICD-10-CM

## 2020-11-05 DIAGNOSIS — N5203 Combined arterial insufficiency and corporo-venous occlusive erectile dysfunction: Secondary | ICD-10-CM

## 2020-11-05 MED ORDER — COLLAGENASE CLOSTRID HISTOLYT 0.9 MG IJ SOLR
0.9000 mg | Freq: Once | INTRAMUSCULAR | Status: AC
  Administered 2020-11-05: 0.9 mg via INTRAMUSCULAR

## 2020-11-05 NOTE — Progress Notes (Signed)
Procedure: Xiaflex injection   Previously marked penile plaque site  Remained visible with marking pen.  The plaque was easily palpable at the left dorsal base/ mid shaft . Xiaflex injection (0.58 mg) was then injected directly into the plaque at the point of maximal curvature at an angle after  to being warm to room temperature using a small TB needle.  He tolerated the procedure  well.   11/05/20

## 2020-11-06 ENCOUNTER — Ambulatory Visit: Payer: Medicare HMO | Admitting: Urology

## 2020-11-06 ENCOUNTER — Other Ambulatory Visit: Payer: Self-pay | Admitting: Urology

## 2020-11-06 ENCOUNTER — Encounter: Payer: Self-pay | Admitting: Urology

## 2020-11-06 VITALS — BP 114/79 | HR 92 | Ht 76.0 in | Wt 178.0 lb

## 2020-11-06 DIAGNOSIS — N486 Induration penis plastica: Secondary | ICD-10-CM | POA: Diagnosis not present

## 2020-11-06 MED ORDER — COLLAGENASE CLOSTRID HISTOLYT 0.9 MG IJ SOLR
0.9000 mg | Freq: Once | INTRAMUSCULAR | Status: AC
  Administered 2020-11-06: 0.9 mg via INTRAMUSCULAR

## 2020-11-06 NOTE — Progress Notes (Signed)
Procedure: Xiaflex injection   Previously marked penile plaque site  Remained visible with marking pen.  The plaque was easily palpable at the left dorsal base/ mid shaft . Xiaflex injection (0.58 mg) was then injected directly into the plaque at the point of maximal curvature at an angle after  to being warm to room temperature using a small TB needle.  He tolerated the procedure  well.   Vanna Scotland, MD   11/06/20

## 2020-11-08 ENCOUNTER — Ambulatory Visit (INDEPENDENT_AMBULATORY_CARE_PROVIDER_SITE_OTHER): Payer: Medicare HMO | Admitting: Urology

## 2020-11-08 ENCOUNTER — Other Ambulatory Visit: Payer: Self-pay

## 2020-11-08 ENCOUNTER — Encounter: Payer: Self-pay | Admitting: Urology

## 2020-11-08 VITALS — BP 132/90 | HR 88 | Ht 76.0 in | Wt 178.0 lb

## 2020-11-08 DIAGNOSIS — N486 Induration penis plastica: Secondary | ICD-10-CM

## 2020-11-08 NOTE — Progress Notes (Signed)
Xiaflex modeling  Patient's Peyronie's plaque is identified and palpated on the dorsal side of the phallus. Wearing gloves, I grasped the plaque about 1 cm proximal and distal to the injection site, making sure to avoid direct pressure on the injection site.  Using the target plaque as a fulcrum point, I used both hands to apply firm, steady pressure to elongate and stretch the plaque.  The penis was bent opposite to the patient's penile curvature, with stretching to the point of moderate resistance. I held pressure for 30 seconds then released. After a 60 second rest period, I repeated the penile modeling technique for a total of 3 modeling attempts at 30 seconds for each attempt.  We discussed penile strengthening and bending exercises at home for the next 6 weeks; instruction sheet provided to the patient. He tolerated well with no concerns.

## 2020-12-18 NOTE — Progress Notes (Signed)
12/19/20 9:12 AM   Nicholas Bailey [birth year] [id number]  Referring provider:  Fleet Contras, MD [address] [place],  [place] [address] Chief Complaint  Patient presents with   Abnormal Penile Curvature     HPI: Nicholas Bailey is a 70 y.o.male with a personal history of Peyronie's disease who presents today for 6 weeks follow-up.   He is now status post 2 rounds of Xiaflex.  His last injection was on 11/06/2020.   He was disappointed in the results of his first injection. He documented 50 degree curvature at the level of the midshaft to the left. After first injection it was 40 to 45 degrees from 50 degrees.  He reports that his second round of Xiaflex was successful.  He is very pleased and reports he now only has about a 5 degree curve.  Still functional.  He reports that he would benefit from more penile fullness.  He is never tried Viagra.   SHIM     Row Name 12/19/20 0853         SHIM: Over the last 6 months:   How do you rate your confidence that you could get and keep an erection? Moderate     When you had erections with sexual stimulation, how often were your erections hard enough for penetration (entering your partner)? Most Times (much more than half the time)     During sexual intercourse, how often were you able to maintain your erection after you had penetrated (entered) your partner? Most Times (much more than half the time)     During sexual intercourse, how difficult was it to maintain your erection to completion of intercourse? Slightly Difficult     When you attempted sexual intercourse, how often was it satisfactory for you? Most Times (much more than half the time)           SHIM Total Score   SHIM 19              PMH: Past Medical History:  Diagnosis Date   Carpal tunnel syndrome    Hyperlipidemia     Surgical History: Past Surgical History:  Procedure Laterality Date   left shoulder surgery     right knee surgery      Home  Medications:  Allergies as of 12/19/2020   No Known Allergies      Medication List        Accurate as of December 19, 2020  9:12 AM. If you have any questions, ask your nurse or doctor.          fenofibrate 160 MG tablet   hydrOXYzine 25 MG tablet Commonly known as: ATARAX/VISTARIL   Naproxen DR 500 MG EC tablet Generic drug: naproxen   sildenafil 20 MG tablet Commonly known as: REVATIO Take 3-5 tablets one hour prior to intercourse Started by: Nicholas Scotland, MD   simvastatin 40 MG tablet Commonly known as: ZOCOR   Xiaflex 0.9 MG Solr Generic drug: Collagenase Clostrid Histolyt Inject 0.58mg  into penile plaque 2 times, 1-3 days apart        Allergies: No Known Allergies  Family History: Family History  Problem Relation Age of Onset   Heart attack Father    Heart disease Brother    Alzheimer's disease Mother    Bladder Cancer Neg Hx    Prostate cancer Neg Hx    Testicular cancer Neg Hx    Kidney cancer Neg Hx     Social History:  reports that he has  quit smoking. He has never used smokeless tobacco. No history on file for alcohol use and drug use.   Physical Exam: BP 118/77   Pulse 94   Ht 6\' 4"  (1.93 m)   Wt 178 lb (80.7 kg)   BMI 21.67 kg/m   Constitutional:  Alert and oriented, No acute distress. HEENT: [place] AT, moist mucus membranes.  Trachea midline, no masses. Cardiovascular: No clubbing, cyanosis, or edema. Respiratory: Normal respiratory effort, no increased work of breathing. Skin: No rashes, bruises or suspicious lesions. Neurologic: Grossly intact, no focal deficits, moving all 4 extremities. Psychiatric: Normal mood and affect.   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  He was provided with a good Rx prescription.  F/u prn  I,Kailey Littlejohn,acting as a scribe for , MD.,have documented all relevant documentation on the behalf of Nicholas Scotland, MD,as directed by  Nicholas Scotland, MD while in the presence of Nicholas Scotland, MD.  I have reviewed the above documentation for accuracy and completeness, and I agree with the above.   Nicholas Scotland, MD   [facility] [address], [address] [place], [place] [phone]

## 2020-12-19 ENCOUNTER — Encounter: Payer: Self-pay | Admitting: Urology

## 2020-12-19 ENCOUNTER — Other Ambulatory Visit: Payer: Self-pay

## 2020-12-19 ENCOUNTER — Ambulatory Visit: Payer: Medicare HMO | Admitting: Urology

## 2020-12-19 VITALS — BP 118/77 | HR 94 | Ht 76.0 in | Wt 178.0 lb

## 2020-12-19 DIAGNOSIS — N5203 Combined arterial insufficiency and corporo-venous occlusive erectile dysfunction: Secondary | ICD-10-CM | POA: Diagnosis not present

## 2020-12-19 MED ORDER — SILDENAFIL CITRATE 20 MG PO TABS: ORAL_TABLET | ORAL | 3 refills | Status: DC

## 2021-06-02 ENCOUNTER — Other Ambulatory Visit: Payer: Self-pay | Admitting: Internal Medicine

## 2021-06-03 LAB — CBC
HCT: 41.8 % (ref 38.5–50.0)
Hemoglobin: 13.5 g/dL (ref 13.2–17.1)
MCH: 28.2 pg (ref 27.0–33.0)
MCHC: 32.3 g/dL (ref 32.0–36.0)
MCV: 87.4 fL (ref 80.0–100.0)
MPV: 10 fL (ref 7.5–12.5)
Platelets: 368 10*3/uL (ref 140–400)
RBC: 4.78 10*6/uL (ref 4.20–5.80)
RDW: 11.9 % (ref 11.0–15.0)
WBC: 8.4 10*3/uL (ref 3.8–10.8)

## 2021-06-03 LAB — COMPLETE METABOLIC PANEL WITH GFR
AG Ratio: 1.7 (calc) (ref 1.0–2.5)
ALT: 14 U/L (ref 9–46)
AST: 18 U/L (ref 10–35)
Albumin: 4.5 g/dL (ref 3.6–5.1)
Alkaline phosphatase (APISO): 43 U/L (ref 35–144)
BUN/Creatinine Ratio: 10 (calc) (ref 6–22)
BUN: 13 mg/dL (ref 7–25)
CO2: 21 mmol/L (ref 20–32)
Calcium: 9.3 mg/dL (ref 8.6–10.3)
Chloride: 103 mmol/L (ref 98–110)
Creat: 1.31 mg/dL — ABNORMAL HIGH (ref 0.70–1.28)
Globulin: 2.7 g/dL (calc) (ref 1.9–3.7)
Glucose, Bld: 89 mg/dL (ref 65–99)
Potassium: 4.5 mmol/L (ref 3.5–5.3)
Sodium: 138 mmol/L (ref 135–146)
Total Bilirubin: 0.6 mg/dL (ref 0.2–1.2)
Total Protein: 7.2 g/dL (ref 6.1–8.1)
eGFR: 59 mL/min/{1.73_m2} — ABNORMAL LOW (ref >=60)

## 2021-06-03 LAB — PSA: PSA: 0.4 ng/mL (ref <=4.00)

## 2021-06-03 LAB — VITAMIN D 25 HYDROXY (VIT D DEFICIENCY, FRACTURES): Vit D, 25-Hydroxy: 34 ng/mL (ref 30–100)

## 2021-06-03 LAB — LIPID PANEL
Cholesterol: 168 mg/dL (ref <200)
HDL: 39 mg/dL — ABNORMAL LOW (ref >=40)
LDL Cholesterol (Calc): 89 mg/dL (calc)
Non-HDL Cholesterol (Calc): 129 mg/dL (calc) (ref <130)
Total CHOL/HDL Ratio: 4.3 (calc) (ref <5.0)
Triglycerides: 335 mg/dL — ABNORMAL HIGH (ref <150)

## 2021-06-03 LAB — URIC ACID: Uric Acid, Serum: 7.2 mg/dL (ref 4.0–8.0)

## 2021-06-03 LAB — TSH: TSH: 0.73 mIU/L (ref 0.40–4.50)

## 2021-09-22 DIAGNOSIS — J302 Other seasonal allergic rhinitis: Secondary | ICD-10-CM | POA: Diagnosis not present

## 2021-09-29 DIAGNOSIS — J302 Other seasonal allergic rhinitis: Secondary | ICD-10-CM | POA: Diagnosis not present

## 2022-03-11 ENCOUNTER — Other Ambulatory Visit: Payer: Self-pay | Admitting: Urology

## 2022-03-18 ENCOUNTER — Other Ambulatory Visit: Payer: Self-pay | Admitting: Urology

## 2022-04-30 ENCOUNTER — Other Ambulatory Visit: Payer: Self-pay | Admitting: Neurosurgery

## 2022-04-30 DIAGNOSIS — M47816 Spondylosis without myelopathy or radiculopathy, lumbar region: Secondary | ICD-10-CM

## 2022-05-07 ENCOUNTER — Ambulatory Visit
Admission: RE | Admit: 2022-05-07 | Discharge: 2022-05-07 | Disposition: A | Discharge status: Home / Self Care | Payer: Medicare HMO | Source: Ambulatory Visit | Attending: Neurosurgery | Admitting: Neurosurgery

## 2022-05-07 DIAGNOSIS — M47816 Spondylosis without myelopathy or radiculopathy, lumbar region: Secondary | ICD-10-CM

## 2022-05-15 ENCOUNTER — Other Ambulatory Visit: Payer: Self-pay | Admitting: Neurosurgery

## 2022-05-18 NOTE — Progress Notes (Signed)
Surgical Instructions    Your procedure is scheduled on Friday, February 9.  Report to [facility] Main Entrance "A" at 1:15PM, then check in with the Admitting office.  Call this number if you have problems the morning of surgery:  [phone]   If you have any questions prior to your surgery date call [phone]: Open Monday-Friday 8am-4pm If you experience any cold or flu symptoms such as cough, fever, chills, shortness of breath, etc. between now and your scheduled surgery, please notify us at the above number     Remember:  Do not eat after midnight the night before your surgery  You may drink clear liquids until 12:15PM the morning of your surgery.   Clear liquids allowed are: Water, Non-Citrus Juices (without pulp), Carbonated Beverages, Clear Tea, Black Coffee ONLY (NO MILK, CREAM OR POWDERED CREAMER of any kind), and Gatorade    Take these medicines the morning of surgery with A SIP OF WATER:  acetaminophen (TYLENOL) if needed fexofenadine (ALLEGRA)  if needed oxyCODONE (OXY IR/ROXICODONE) if needed   As of today, STOP taking any Aspirin (unless otherwise instructed by your surgeon) Aleve, Naproxen, Ibuprofen, Motrin, Advil, Goody's, BC's, all herbal medications, fish oil, and all vitamins.             [facility] is not responsible for any belongings or valuables.    Do NOT Smoke (Tobacco/Vaping)  24 hours prior to your procedure  If you use a CPAP at night, you may bring your mask for your overnight stay.   Contacts, glasses, hearing aids, dentures or partials may not be worn into surgery, please bring cases for these belongings   For patients admitted to the hospital, discharge time will be determined by your treatment team.   Patients discharged the day of surgery will not be allowed to drive home, and someone needs to stay with them for 24 hours.   SURGICAL WAITING ROOM VISITATION Patients having surgery or a procedure may have no more than 2 support people  in the waiting area - these visitors may rotate.   Children under the age of 23 must have an adult with them who is not the patient. If the patient needs to stay at the hospital during part of their recovery, the visitor guidelines for inpatient rooms apply. Pre-op nurse will coordinate an appropriate time for 1 support person to accompany patient in pre-op.  This support person may not rotate.   Please refer to [email or web address] for the visitor guidelines for Inpatients (after your surgery is over and you are in a regular room).    Special instructions:    Oral Hygiene is also important to reduce your risk of infection.  Remember - BRUSH YOUR TEETH THE MORNING OF SURGERY WITH YOUR REGULAR TOOTHPASTE   [place]- Preparing For Surgery  Before surgery, you can play an important role. Because skin is not sterile, your skin needs to be as free of germs as possible. You can reduce the number of germs on your skin by washing with CHG (chlorahexidine gluconate) Soap before surgery.  CHG is an antiseptic cleaner which kills germs and bonds with the skin to continue killing germs even after washing.     Please do not use if you have an allergy to CHG or antibacterial soaps. If your skin becomes reddened/irritated stop using the CHG.  Do not shave (including legs and underarms) for at least 48 hours prior to first CHG shower. It is OK to shave your face.  Please follow these instructions carefully.     Shower the NIGHT BEFORE SURGERY and the MORNING OF SURGERY with CHG Soap.   If you chose to wash your hair, wash your hair first as usual with your normal shampoo. After you shampoo, rinse your hair and body thoroughly to remove the shampoo.  Then [employer] and genitals (private parts) with your normal soap and rinse thoroughly to remove soap.  After that Use CHG Soap as you would any other liquid soap. You can apply CHG directly to the skin and  wash gently with a scrungie or a clean washcloth.   Apply the CHG Soap to your body ONLY FROM THE NECK DOWN.  Do not use on open wounds or open sores. Avoid contact with your eyes, ears, mouth and genitals (private parts). Wash Face and genitals (private parts)  with your normal soap.   Wash thoroughly, paying special attention to the area where your surgery will be performed.  Thoroughly rinse your body with warm water from the neck down.  DO NOT shower/wash with your normal soap after using and rinsing off the CHG Soap.  Pat yourself dry with a CLEAN TOWEL.  Wear CLEAN PAJAMAS to bed the night before surgery  Place CLEAN SHEETS on your bed the night before your surgery  DO NOT SLEEP WITH PETS.   Day of Surgery:  Take a shower with CHG soap. Wear Clean/Comfortable clothing the morning of surgery Do not wear jewelry or makeup. Do not wear lotions, powders, perfumes/cologne or deodorant. Do not shave 48 hours prior to surgery.  Men may shave face and neck. Do not bring valuables to the hospital. Do not wear nail polish, gel polish, artificial nails, or any other type of covering on natural nails (fingers and toes) If you have artificial nails or gel coating that need to be removed by a nail salon, please have this removed prior to surgery. Artificial nails or gel coating may interfere with anesthesia's ability to adequately monitor your vital signs. Remember to brush your teeth WITH YOUR REGULAR TOOTHPASTE.    If you received a COVID test during your pre-op visit, it is requested that you wear a mask when out in public, stay away from anyone that may not be feeling well, and notify your surgeon if you develop symptoms. If you have been in contact with anyone that has tested positive in the last 10 days, please notify your surgeon.    Please read over the following fact sheets that you were given.

## 2022-05-19 ENCOUNTER — Other Ambulatory Visit: Payer: Self-pay

## 2022-05-19 ENCOUNTER — Encounter (HOSPITAL_COMMUNITY): Payer: Self-pay

## 2022-05-19 ENCOUNTER — Encounter (HOSPITAL_COMMUNITY)
Admission: RE | Admit: 2022-05-19 | Discharge: 2022-05-19 | Disposition: A | Discharge status: Home / Self Care | Payer: Medicare HMO | Source: Ambulatory Visit | Attending: Neurosurgery | Admitting: Neurosurgery

## 2022-05-19 VITALS — BP 153/90 | Temp 98.2°F | Resp 17 | Ht 76.0 in | Wt 171.9 lb

## 2022-05-19 DIAGNOSIS — Z01818 Encounter for other preprocedural examination: Secondary | ICD-10-CM | POA: Diagnosis present

## 2022-05-19 LAB — BASIC METABOLIC PANEL
Anion gap: 7 (ref 5–15)
BUN: 9 mg/dL (ref 8–23)
CO2: 25 mmol/L (ref 22–32)
Calcium: 9.1 mg/dL (ref 8.9–10.3)
Chloride: 104 mmol/L (ref 98–111)
Creatinine, Ser: 1.15 mg/dL (ref 0.61–1.24)
GFR, Estimated: >60 mL/min (ref >60)
Glucose, Bld: 103 mg/dL — ABNORMAL HIGH (ref 70–99)
Potassium: 3.9 mmol/L (ref 3.5–5.1)
Sodium: 136 mmol/L (ref 135–145)

## 2022-05-19 LAB — CBC
HCT: 45.5 % (ref 39.0–52.0)
Hemoglobin: 14.8 g/dL (ref 13.0–17.0)
MCH: 28.8 pg (ref 26.0–34.0)
MCHC: 32.5 g/dL (ref 30.0–36.0)
MCV: 88.7 fL (ref 80.0–100.0)
Platelets: 310 10*3/uL (ref 150–400)
RBC: 5.13 MIL/uL (ref 4.22–5.81)
RDW: 12.3 % (ref 11.5–15.5)
WBC: 8.9 10*3/uL (ref 4.0–10.5)
nRBC: 0 % (ref 0.0–0.2)

## 2022-05-19 LAB — SURGICAL PCR SCREEN
MRSA, PCR: NEGATIVE
Staphylococcus aureus: NEGATIVE

## 2022-05-19 LAB — TYPE AND SCREEN
ABO/RH(D): O POS
Antibody Screen: NEGATIVE

## 2022-05-19 NOTE — Progress Notes (Signed)
PCP - Nolene Ebbs, MD Cardiologist - denies  PPM/ICD - denies Device Orders - n/a Rep Notified - n/a  Chest x-ray - n/a EKG - 05/19/22 Stress Test - denies ECHO - denies Cardiac Cath - denies  Sleep Study - denies CPAP - n/a  Fasting Blood Sugar - n/a  Blood Thinner Instructions: n/a Patient was instructed: As of today, STOP taking any Aspirin (unless otherwise instructed by your surgeon) Aleve, Naproxen, Ibuprofen, Motrin, Advil, Goody's, BC's, all herbal medications, fish oil, and all vitamins.   ERAS Protcol - yes, until 12: 15 o'clock  COVID TEST- n/a   Anesthesia review: yes - abnormal EKG in PAT (no cardiac history - patient denied any acute distress)  Patient denies shortness of breath, fever, cough and chest pain at PAT appointment   All instructions explained to the patient, with a verbal understanding of the material. Patient agrees to go over the instructions while at home for a better understanding. Patient also instructed to self quarantine after being tested for COVID-19. The opportunity to ask questions was provided.

## 2022-05-21 NOTE — Progress Notes (Signed)
Called pt twice, left a message to arrive at 1430 and to stop clear liquids at 1330.

## 2022-05-22 ENCOUNTER — Ambulatory Visit (HOSPITAL_COMMUNITY): Payer: Medicare HMO | Admitting: Physician Assistant

## 2022-05-22 ENCOUNTER — Ambulatory Visit (HOSPITAL_BASED_OUTPATIENT_CLINIC_OR_DEPARTMENT_OTHER): Payer: Medicare HMO | Admitting: Physician Assistant

## 2022-05-22 ENCOUNTER — Observation Stay (HOSPITAL_COMMUNITY)
Admission: RE | Admit: 2022-05-22 | Discharge: 2022-05-23 | Disposition: A | Discharge status: Home / Self Care | Payer: Medicare HMO | Attending: Neurosurgery | Admitting: Neurosurgery

## 2022-05-22 ENCOUNTER — Other Ambulatory Visit: Payer: Self-pay

## 2022-05-22 ENCOUNTER — Encounter (HOSPITAL_COMMUNITY): Payer: Self-pay | Admitting: Neurosurgery

## 2022-05-22 ENCOUNTER — Encounter (HOSPITAL_COMMUNITY)
Admission: RE | Disposition: A | Discharge status: Home / Self Care | Payer: Self-pay | Source: Home / Self Care | Attending: Neurosurgery

## 2022-05-22 ENCOUNTER — Ambulatory Visit (HOSPITAL_COMMUNITY): Payer: Medicare HMO

## 2022-05-22 DIAGNOSIS — M5137 Other intervertebral disc degeneration, lumbosacral region: Secondary | ICD-10-CM | POA: Diagnosis not present

## 2022-05-22 DIAGNOSIS — M5127 Other intervertebral disc displacement, lumbosacral region: Secondary | ICD-10-CM | POA: Diagnosis not present

## 2022-05-22 DIAGNOSIS — M4317 Spondylolisthesis, lumbosacral region: Secondary | ICD-10-CM | POA: Diagnosis not present

## 2022-05-22 DIAGNOSIS — Z01818 Encounter for other preprocedural examination: Secondary | ICD-10-CM

## 2022-05-22 DIAGNOSIS — Z981 Arthrodesis status: Secondary | ICD-10-CM

## 2022-05-22 DIAGNOSIS — M48061 Spinal stenosis, lumbar region without neurogenic claudication: Principal | ICD-10-CM | POA: Diagnosis present

## 2022-05-22 LAB — ABO/RH: ABO/RH(D): O POS

## 2022-05-22 SURGERY — POSTERIOR LUMBAR FUSION 1 LEVEL
Anesthesia: General | Site: Back

## 2022-05-22 MED ORDER — SODIUM CHLORIDE 0.9 % IV SOLN: 250.0000 mL | INTRAVENOUS | Status: DC

## 2022-05-22 MED ORDER — ONDANSETRON HCL 4 MG/2ML IJ SOLN: 4.0000 mg | Freq: Four times a day (QID) | INTRAMUSCULAR | Status: DC | PRN

## 2022-05-22 MED ORDER — LORATADINE 10 MG PO TABS: 10.0000 mg | ORAL_TABLET | Freq: Every day | ORAL | Status: DC

## 2022-05-22 MED ORDER — ONDANSETRON HCL 4 MG PO TABS: 4.0000 mg | ORAL_TABLET | Freq: Four times a day (QID) | ORAL | Status: DC | PRN

## 2022-05-22 MED ORDER — SODIUM CHLORIDE 0.9% FLUSH: 3.0000 mL | INTRAVENOUS | Status: DC | PRN

## 2022-05-22 MED ORDER — LIDOCAINE 2% (20 MG/ML) 5 ML SYRINGE
INTRAMUSCULAR | Status: AC
  Filled 2022-05-22: qty 5

## 2022-05-22 MED ORDER — POTASSIUM CHLORIDE IN NACL 20-0.9 MEQ/L-% IV SOLN: INTRAVENOUS | Status: DC

## 2022-05-22 MED ORDER — LIDOCAINE 2% (20 MG/ML) 5 ML SYRINGE
INTRAMUSCULAR | Status: DC | PRN
  Administered 2022-05-22: 100 mg via INTRAVENOUS

## 2022-05-22 MED ORDER — ACETAMINOPHEN 325 MG PO TABS: 650.0000 mg | ORAL_TABLET | ORAL | Status: DC | PRN

## 2022-05-22 MED ORDER — OXYCODONE HCL ER 10 MG PO T12A: 10.0000 mg | EXTENDED_RELEASE_TABLET | Freq: Two times a day (BID) | ORAL | Status: DC

## 2022-05-22 MED ORDER — CHLORHEXIDINE GLUCONATE 0.12 % MT SOLN
15.0000 mL | Freq: Once | OROMUCOSAL | Status: AC
  Administered 2022-05-22: 15 mL via OROMUCOSAL
  Filled 2022-05-22: qty 15

## 2022-05-22 MED ORDER — LACTATED RINGERS IV SOLN: INTRAVENOUS | Status: DC

## 2022-05-22 MED ORDER — FENTANYL CITRATE (PF) 250 MCG/5ML IJ SOLN
INTRAMUSCULAR | Status: AC
  Filled 2022-05-22: qty 5

## 2022-05-22 MED ORDER — PROPOFOL 10 MG/ML IV BOLUS
INTRAVENOUS | Status: AC
  Filled 2022-05-22: qty 20

## 2022-05-22 MED ORDER — SODIUM CHLORIDE 0.9% FLUSH: 3.0000 mL | Freq: Two times a day (BID) | INTRAVENOUS | Status: DC

## 2022-05-22 MED ORDER — MORPHINE SULFATE (PF) 2 MG/ML IV SOLN: 2.0000 mg | INTRAVENOUS | Status: DC | PRN

## 2022-05-22 MED ORDER — PROMETHAZINE HCL 25 MG/ML IJ SOLN: 6.2500 mg | INTRAMUSCULAR | Status: DC | PRN

## 2022-05-22 MED ORDER — DEXAMETHASONE SODIUM PHOSPHATE 4 MG/ML IJ SOLN
INTRAMUSCULAR | Status: DC | PRN
  Administered 2022-05-22: 10 mg via INTRAVENOUS

## 2022-05-22 MED ORDER — FENOFIBRATE 160 MG PO TABS
160.0000 mg | ORAL_TABLET | Freq: Every day | ORAL | Status: DC
  Administered 2022-05-23: 160 mg via ORAL
  Filled 2022-05-22: qty 1

## 2022-05-22 MED ORDER — VITAMIN C 500 MG PO TABS: 500.0000 mg | ORAL_TABLET | ORAL | Status: DC

## 2022-05-22 MED ORDER — HYDROMORPHONE HCL 1 MG/ML IJ SOLN
INTRAMUSCULAR | Status: AC
  Filled 2022-05-22: qty 1

## 2022-05-22 MED ORDER — CHLORHEXIDINE GLUCONATE CLOTH 2 % EX PADS: 6.0000 | MEDICATED_PAD | Freq: Once | CUTANEOUS | Status: DC

## 2022-05-22 MED ORDER — THROMBIN 20000 UNITS EX SOLR
CUTANEOUS | Status: DC | PRN
  Administered 2022-05-22: 20 mL via TOPICAL

## 2022-05-22 MED ORDER — OXYCODONE HCL 5 MG/5ML PO SOLN
ORAL | Status: AC
  Filled 2022-05-22: qty 5

## 2022-05-22 MED ORDER — HYDROMORPHONE HCL 1 MG/ML IJ SOLN
0.2500 mg | INTRAMUSCULAR | Status: DC | PRN
  Administered 2022-05-22: 0.5 mg via INTRAVENOUS
  Administered 2022-05-22 (×2): 0.25 mg via INTRAVENOUS
  Administered 2022-05-22 (×2): 0.5 mg via INTRAVENOUS

## 2022-05-22 MED ORDER — MEPERIDINE HCL 25 MG/ML IJ SOLN: 6.2500 mg | INTRAMUSCULAR | Status: DC | PRN

## 2022-05-22 MED ORDER — LACTATED RINGERS IV SOLN: INTRAVENOUS | Status: DC | PRN

## 2022-05-22 MED ORDER — OXYCODONE HCL 5 MG PO TABS
10.0000 mg | ORAL_TABLET | ORAL | Status: DC | PRN
  Administered 2022-05-23 (×3): 10 mg via ORAL
  Filled 2022-05-22 (×4): qty 2

## 2022-05-22 MED ORDER — ROCURONIUM BROMIDE 10 MG/ML (PF) SYRINGE
PREFILLED_SYRINGE | INTRAVENOUS | Status: AC
  Filled 2022-05-22: qty 10

## 2022-05-22 MED ORDER — MIDAZOLAM HCL 5 MG/5ML IJ SOLN
INTRAMUSCULAR | Status: DC | PRN
  Administered 2022-05-22: 2 mg via INTRAVENOUS

## 2022-05-22 MED ORDER — BISACODYL 5 MG PO TBEC: 5.0000 mg | DELAYED_RELEASE_TABLET | Freq: Every day | ORAL | Status: DC | PRN

## 2022-05-22 MED ORDER — SENNOSIDES-DOCUSATE SODIUM 8.6-50 MG PO TABS: 1.0000 | ORAL_TABLET | Freq: Every evening | ORAL | Status: DC | PRN

## 2022-05-22 MED ORDER — LIDOCAINE-EPINEPHRINE 0.5 %-1:200000 IJ SOLN
INTRAMUSCULAR | Status: DC | PRN
  Administered 2022-05-22: 6 mL via INTRADERMAL

## 2022-05-22 MED ORDER — BUPIVACAINE HCL (PF) 0.5 % IJ SOLN
INTRAMUSCULAR | Status: DC | PRN
  Administered 2022-05-22: 20 mL

## 2022-05-22 MED ORDER — PHENOL 1.4 % MT LIQD: 1.0000 | OROMUCOSAL | Status: DC | PRN

## 2022-05-22 MED ORDER — CEFAZOLIN SODIUM-DEXTROSE 2-4 GM/100ML-% IV SOLN
2.0000 g | INTRAVENOUS | Status: AC
  Administered 2022-05-22: 2 g via INTRAVENOUS
  Filled 2022-05-22: qty 100

## 2022-05-22 MED ORDER — ZOLPIDEM TARTRATE 5 MG PO TABS: 5.0000 mg | ORAL_TABLET | Freq: Every evening | ORAL | Status: DC | PRN

## 2022-05-22 MED ORDER — DIAZEPAM 5 MG PO TABS
5.0000 mg | ORAL_TABLET | Freq: Four times a day (QID) | ORAL | Status: DC | PRN
  Administered 2022-05-22: 5 mg via ORAL

## 2022-05-22 MED ORDER — PROPOFOL 10 MG/ML IV BOLUS
INTRAVENOUS | Status: DC | PRN
  Administered 2022-05-22: 180 mg via INTRAVENOUS

## 2022-05-22 MED ORDER — ROCURONIUM BROMIDE 100 MG/10ML IV SOLN
INTRAVENOUS | Status: DC | PRN
  Administered 2022-05-22 (×2): 10 mg via INTRAVENOUS
  Administered 2022-05-22: 20 mg via INTRAVENOUS
  Administered 2022-05-22: 50 mg via INTRAVENOUS

## 2022-05-22 MED ORDER — THROMBIN 20000 UNITS EX SOLR
CUTANEOUS | Status: AC
  Filled 2022-05-22: qty 20000

## 2022-05-22 MED ORDER — BUPIVACAINE HCL (PF) 0.5 % IJ SOLN
INTRAMUSCULAR | Status: AC
  Filled 2022-05-22: qty 30

## 2022-05-22 MED ORDER — 0.9 % SODIUM CHLORIDE (POUR BTL) OPTIME
TOPICAL | Status: DC | PRN
  Administered 2022-05-22: 1000 mL

## 2022-05-22 MED ORDER — OXYCODONE HCL 5 MG PO TABS: 5.0000 mg | ORAL_TABLET | Freq: Once | ORAL | Status: AC | PRN

## 2022-05-22 MED ORDER — DIAZEPAM 5 MG PO TABS
ORAL_TABLET | ORAL | Status: AC
  Filled 2022-05-22: qty 1

## 2022-05-22 MED ORDER — HYDROXYZINE HCL 25 MG PO TABS: 25.0000 mg | ORAL_TABLET | Freq: Every evening | ORAL | Status: DC | PRN

## 2022-05-22 MED ORDER — SUGAMMADEX SODIUM 200 MG/2ML IV SOLN
INTRAVENOUS | Status: DC | PRN
  Administered 2022-05-22: 200 mg via INTRAVENOUS

## 2022-05-22 MED ORDER — CELECOXIB 200 MG PO CAPS
200.0000 mg | ORAL_CAPSULE | Freq: Two times a day (BID) | ORAL | Status: DC
  Administered 2022-05-23: 200 mg via ORAL
  Filled 2022-05-22: qty 1

## 2022-05-22 MED ORDER — ACETAMINOPHEN 10 MG/ML IV SOLN
1000.0000 mg | Freq: Once | INTRAVENOUS | Status: AC
  Administered 2022-05-22: 1000 mg via INTRAVENOUS

## 2022-05-22 MED ORDER — ACETAMINOPHEN 500 MG PO TABS
1000.0000 mg | ORAL_TABLET | Freq: Once | ORAL | Status: AC
  Administered 2022-05-22: 1000 mg via ORAL
  Filled 2022-05-22: qty 2

## 2022-05-22 MED ORDER — LIDOCAINE-EPINEPHRINE 0.5 %-1:200000 IJ SOLN
INTRAMUSCULAR | Status: AC
  Filled 2022-05-22: qty 1

## 2022-05-22 MED ORDER — PHENYLEPHRINE HCL-NACL 20-0.9 MG/250ML-% IV SOLN
INTRAVENOUS | Status: DC | PRN
  Administered 2022-05-22: 30 ug/min via INTRAVENOUS

## 2022-05-22 MED ORDER — MENTHOL 3 MG MT LOZG: 1.0000 | LOZENGE | OROMUCOSAL | Status: DC | PRN

## 2022-05-22 MED ORDER — FENTANYL CITRATE (PF) 100 MCG/2ML IJ SOLN
INTRAMUSCULAR | Status: DC | PRN
  Administered 2022-05-22: 100 ug via INTRAVENOUS
  Administered 2022-05-22 (×3): 50 ug via INTRAVENOUS

## 2022-05-22 MED ORDER — ORAL CARE MOUTH RINSE: 15.0000 mL | Freq: Once | OROMUCOSAL | Status: AC

## 2022-05-22 MED ORDER — ACETAMINOPHEN 10 MG/ML IV SOLN
INTRAVENOUS | Status: AC
  Filled 2022-05-22: qty 100

## 2022-05-22 MED ORDER — OXYCODONE HCL 5 MG/5ML PO SOLN
5.0000 mg | Freq: Once | ORAL | Status: AC | PRN
  Administered 2022-05-22: 5 mg via ORAL

## 2022-05-22 MED ORDER — ACETAMINOPHEN 650 MG RE SUPP: 650.0000 mg | RECTAL | Status: DC | PRN

## 2022-05-22 MED ORDER — SENNA 8.6 MG PO TABS
1.0000 | ORAL_TABLET | Freq: Two times a day (BID) | ORAL | Status: DC
  Administered 2022-05-23: 8.6 mg via ORAL
  Filled 2022-05-22: qty 1

## 2022-05-22 MED ORDER — OXYCODONE HCL 5 MG PO TABS: 5.0000 mg | ORAL_TABLET | ORAL | Status: DC | PRN

## 2022-05-22 MED ORDER — SIMVASTATIN 20 MG PO TABS
40.0000 mg | ORAL_TABLET | Freq: Every day | ORAL | Status: DC
  Administered 2022-05-23: 40 mg via ORAL
  Filled 2022-05-22: qty 2

## 2022-05-22 MED ORDER — MIDAZOLAM HCL 2 MG/2ML IJ SOLN
INTRAMUSCULAR | Status: AC
  Filled 2022-05-22: qty 2

## 2022-05-22 SURGICAL SUPPLY — 64 items
BAG COUNTER SPONGE SURGICOUNT (BAG) ×1 IMPLANT
BASKET BONE COLLECTION (BASKET) ×1 IMPLANT
BENZOIN TINCTURE PRP APPL 2/3 (GAUZE/BANDAGES/DRESSINGS) IMPLANT
BIT DRILL PLIF MAS DISP 5.5MM (DRILL) IMPLANT
BLADE BONE MILL MEDIUM (MISCELLANEOUS) ×1 IMPLANT
BLADE CLIPPER SURG (BLADE) IMPLANT
BUR MATCHSTICK NEURO 3.0 LAGG (BURR) ×1 IMPLANT
BUR PRECISION FLUTE 5.0 (BURR) ×1 IMPLANT
CANISTER SUCT 3000ML PPV (MISCELLANEOUS) ×1 IMPLANT
CAP RELINE MOD TULIP RMM (Cap) IMPLANT
CNTNR URN SCR LID CUP LEK RST (MISCELLANEOUS) ×1 IMPLANT
CONT SPEC 4OZ STRL OR WHT (MISCELLANEOUS) ×1
COVER BACK TABLE 60X90IN (DRAPES) ×1 IMPLANT
DERMABOND ADVANCED .7 DNX12 (GAUZE/BANDAGES/DRESSINGS) ×1 IMPLANT
DERMABOND ADVANCED .7 DNX6 (GAUZE/BANDAGES/DRESSINGS) IMPLANT
DRAPE C-ARM 42X72 X-RAY (DRAPES) ×2 IMPLANT
DRAPE C-ARMOR (DRAPES) IMPLANT
DRAPE LAPAROTOMY 100X72X124 (DRAPES) ×1 IMPLANT
DRAPE SURG 17X23 STRL (DRAPES) ×1 IMPLANT
DRILL PLIF MAS DISP 5.5MM (DRILL) ×1
DRSG OPSITE POSTOP 4X6 (GAUZE/BANDAGES/DRESSINGS) IMPLANT
DURAPREP 26ML APPLICATOR (WOUND CARE) ×1 IMPLANT
ELECT REM PT RETURN 9FT ADLT (ELECTROSURGICAL) ×1
ELECTRODE REM PT RTRN 9FT ADLT (ELECTROSURGICAL) ×1 IMPLANT
GAUZE 4X4 16PLY ~~LOC~~+RFID DBL (SPONGE) IMPLANT
GAUZE SPONGE 4X4 12PLY STRL (GAUZE/BANDAGES/DRESSINGS) IMPLANT
GLOVE BIO SURGEON STRL SZ7 (GLOVE) IMPLANT
GLOVE BIOGEL PI IND STRL 7.5 (GLOVE) IMPLANT
GLOVE EXAM NITRILE XL STR (GLOVE) IMPLANT
GLOVE SURG LTX SZ6.5 (GLOVE) ×2 IMPLANT
GOWN STRL REUS W/ TWL LRG LVL3 (GOWN DISPOSABLE) ×2 IMPLANT
GOWN STRL REUS W/ TWL XL LVL3 (GOWN DISPOSABLE) IMPLANT
GOWN STRL REUS W/TWL 2XL LVL3 (GOWN DISPOSABLE) IMPLANT
GOWN STRL REUS W/TWL LRG LVL3 (GOWN DISPOSABLE) ×2
GOWN STRL REUS W/TWL XL LVL3 (GOWN DISPOSABLE)
KIT BASIN OR (CUSTOM PROCEDURE TRAY) ×1 IMPLANT
KIT POSITION SURG JACKSON T1 (MISCELLANEOUS) ×1 IMPLANT
KIT TURNOVER KIT B (KITS) ×1 IMPLANT
MILL BONE PREP (MISCELLANEOUS) IMPLANT
NDL HYPO 25X1 1.5 SAFETY (NEEDLE) ×1 IMPLANT
NDL SPNL 18GX3.5 QUINCKE PK (NEEDLE) IMPLANT
NEEDLE HYPO 25X1 1.5 SAFETY (NEEDLE) ×1 IMPLANT
NEEDLE SPNL 18GX3.5 QUINCKE PK (NEEDLE) IMPLANT
NS IRRIG 1000ML POUR BTL (IV SOLUTION) ×1 IMPLANT
PACK LAMINECTOMY NEURO (CUSTOM PROCEDURE TRAY) ×1 IMPLANT
PAD ARMBOARD 7.5X6 YLW CONV (MISCELLANEOUS) ×2 IMPLANT
ROD RELINE COCR LORD 5X30 (Rod) IMPLANT
SCREW LOCK RSS 4.5/5.0MM (Screw) IMPLANT
SCREW SHANK RELINE MOD 5.5X35 (Screw) IMPLANT
SCREW SHANK RELINE MOD 6.5X35 (Screw) IMPLANT
SPACER EXP PROLIFT 8X28X8 8D (Spacer) IMPLANT
SPIKE FLUID TRANSFER (MISCELLANEOUS) ×1 IMPLANT
SPONGE SURGIFOAM ABS GEL 100 (HEMOSTASIS) ×1 IMPLANT
SPONGE T-LAP 4X18 ~~LOC~~+RFID (SPONGE) IMPLANT
STRIP CLOSURE SKIN 1/2X4 (GAUZE/BANDAGES/DRESSINGS) IMPLANT
SUT PROLENE 6 0 BV (SUTURE) IMPLANT
SUT VIC AB 0 CT1 18XCR BRD8 (SUTURE) ×1 IMPLANT
SUT VIC AB 0 CT1 8-18 (SUTURE) ×2
SUT VIC AB 2-0 CT1 18 (SUTURE) ×1 IMPLANT
SUT VIC AB 3-0 SH 8-18 (SUTURE) ×1 IMPLANT
TOWEL GREEN STERILE (TOWEL DISPOSABLE) ×1 IMPLANT
TOWEL GREEN STERILE FF (TOWEL DISPOSABLE) ×1 IMPLANT
TRAY FOLEY MTR SLVR 16FR STAT (SET/KITS/TRAYS/PACK) ×1 IMPLANT
WATER STERILE IRR 1000ML POUR (IV SOLUTION) ×1 IMPLANT

## 2022-05-22 NOTE — Op Note (Signed)
05/22/2022  10:48 PM  PATIENT:  Nicholas Bailey  72 y.o. male With severe pain in the left lower extremity due to a disc herniation. He has a listhesis due to facet embarrassment and degenerative disc disease. He has not responded to conservative measures and has opted for operative decompression.  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 male whom was taken to the operating room intubated, and placed under a general anesthetic without difficulty. A foley catheter was placed under sterile conditions. He was positioned prone on a Jackson table with all pressure points properly padded.  His lumbar region was prepped and draped in a sterile manner. I infiltrated 10cc's 1/2%lidocaine/1:2000,000 strength epinephrine into the planned incision. I opened the skin with a 10 blade and took the incision down to the thoracolumbar fascia. I exposed the lamina of L5, and S1 in a subperiosteal fashion on the left. I confirmed my location with an intraoperative xray.  I placed self retaining retractors and started the decompression.  I decompressed the spinal canal on the left with a hemilaminectomy of L5 on the left. I exposed the disc space and disc herniation. I performed a discetomy at L5/S1. I used various tools to remove disc on both sides of the disc space. I performed an inferior facetectomy of L5, and partial superior  facetectomy of S1 in order to decompress the thecal sac, and the left S1 nerve root. A Plif was performed at L5/S1 in the same fashion. I opened the disc space with a 15 blade then used a variety of instruments to remove the disc and prepare the space for the arthrodesis. I used curettes, rongeurs, punches, shavers for the disc space, and rasps in the discetomy. I measured the disc space and placed one titanium expandable Stryker cage  into the disc space(s). The cage and the disc space were packed with local autograft.   I placed pedicle screws at L5, and S1 on the left, using fluoroscopic guidance. I drilled a pilot hole, then cannulated the pedicle with a drill at each site. I then tapped each pedicle, assessing each site for pedicle violations. No cutouts were appreciated. Screws (nuvasive) were then placed at each site without difficulty. I attached a rod and locking caps with the appropriate tools. The locking caps were secured with torque limited screwdrivers. Final films were performed and the final construct appeared to be in good position.  I closed the wound in a layered fashion. I approximated the thoracolumbar fascia, subcutaneous, and subcuticular planes with vicryl sutures. I used dermabond for a sterile dressing.     PLAN OF CARE: Admit to inpatient   PATIENT DISPOSITION:  PACU - hemodynamically stable.   Delay start of Pharmacological VTE agent (>24hrs) due to surgical blood loss or risk of bleeding:  yes

## 2022-05-22 NOTE — Transfer of Care (Signed)
Immediate Anesthesia Transfer of Care Note  Patient: Nicholas Bailey  Procedure(s) Performed: Left Lumbar Five- Sacral One Posterior Lumbar Interbody Fusion and Arthrodesis (Back)  Patient Location: PACU  Anesthesia Type:General  Level of Consciousness: awake and alert   Airway & Oxygen Therapy: Patient Spontanous Breathing and Patient connected to nasal cannula oxygen  Post-op Assessment: Report given to RN, Post -op Vital signs reviewed and stable, and Patient moving all extremities X 4  Post vital signs: Reviewed and stable  Last Vitals:  Vitals Value Taken Time  BP 137/82 05/22/22 2222  Temp    Pulse 82 05/22/22 2229  Resp 21 05/22/22 2229  SpO2 97 % 05/22/22 2229  Vitals shown include unvalidated device data.  Last Pain:  Vitals:   05/22/22 1449  TempSrc:   PainSc: 4          Complications: No notable events documented.

## 2022-05-22 NOTE — Anesthesia Preprocedure Evaluation (Addendum)
Anesthesia Evaluation  Patient identified by MRN, date of birth, ID band Patient awake    Reviewed: Allergy & Precautions, NPO status , Patient's Chart, lab work & pertinent test results  Airway Mallampati: II  TM Distance: >3 FB Neck ROM: Full    Dental  (+) Dental Advisory Given, Teeth Intact   Pulmonary Current Smoker   Pulmonary exam normal breath sounds clear to auscultation       Cardiovascular negative cardio ROS Normal cardiovascular exam Rhythm:Regular Rate:Normal     Neuro/Psych  Neuromuscular disease    GI/Hepatic negative GI ROS, Neg liver ROS,,,  Endo/Other  negative endocrine ROS    Renal/GU negative Renal ROS     Musculoskeletal  (+) Arthritis ,    Abdominal   Peds  Hematology negative hematology ROS (+)   Anesthesia Other Findings   Reproductive/Obstetrics                             Anesthesia Physical Anesthesia Plan  ASA: 2  Anesthesia Plan: General   Post-op Pain Management: Tylenol PO (pre-op)*   Induction: Intravenous  PONV Risk Score and Plan: 2 and Ondansetron, Dexamethasone and Treatment may vary due to age or medical condition  Airway Management Planned: Oral ETT  Additional Equipment: None  Intra-op Plan:   Post-operative Plan: Extubation in OR  Informed Consent: I have reviewed the patients History and Physical, chart, labs and discussed the procedure including the risks, benefits and alternatives for the proposed anesthesia with the patient or authorized representative who has indicated his/her understanding and acceptance.     Dental advisory given  Plan Discussed with: CRNA  Anesthesia Plan Comments:        Anesthesia Quick Evaluation

## 2022-05-22 NOTE — Anesthesia Procedure Notes (Signed)
Procedure Name: Intubation Date/Time: 05/22/2022 6:37 PM  Performed by: Elvin So, CRNAPre-anesthesia Checklist: Patient identified, Emergency Drugs available, Suction available and Patient being monitored Patient Re-evaluated:Patient Re-evaluated prior to induction Oxygen Delivery Method: Circle System Utilized Preoxygenation: Pre-oxygenation with 100% oxygen Induction Type: IV induction Ventilation: Mask ventilation without difficulty Laryngoscope Size: Mac and 4 Grade View: Grade I Tube type: Oral Tube size: 7.5 mm Number of attempts: 1 Airway Equipment and Method: Stylet and Oral airway Placement Confirmation: ETT inserted through vocal cords under direct vision, positive ETCO2 and breath sounds checked- equal and bilateral Secured at: 22 cm Tube secured with: Tape Dental Injury: Teeth and Oropharynx as per pre-operative assessment

## 2022-05-22 NOTE — H&P (Signed)
  BP (!) 140/94   Pulse 89   Temp 98 F (36.7 C) (Oral)   Resp 18   Ht 6\' 4"  (1.93 m)   Wt 80.7 kg   SpO2 99%   BMI 21.67 kg/m     Mr. Blethen comes in today with renewed pain in his back and both hips and pain that radiates into the groin.  He had a diagnosis of sacroiliitis which was given to him by Dr. Avon Gully.  Maybe that is flaring up, or maybe he has some hip problem.  He has had no bowel or bladder dysfunction.  He last received an injection from Dr. Brien Few in April of last year.  He has had both RF ablations, in addition to the blocks.  He weighs 176 pounds.  Blood pressure is 142/82, pulse 98. Pain is 8/10.  The pain has come back with a vengeance recently, since approximately September of this year.     He does not have a new MRI, the last one being done in August 2022.  He has had nothing but conservative treatment since that time. Therefore, I would like to have him undergo a new MRI without contrast of the lumbar spine.  I will take some hip x-rays today, since there is a possibility, when on Patrick's maneuver I did have him wince in pain, that the problem could be in his hips. Mr. Xiang returns today with an MRI of the lumbar spine.  What it shows is that he has a fairly large herniated disc at L5-S1 and severe facet arthropathy at that same level, L5-S1.  We went through old films that he had, especially one in 2020, and he had progressed significantly from that time.  Given the amount of pain that he is in, given the location, I have recommended that he undergo an arthrodesis, in addition to the discectomy.  He has some anterolisthesis.  He has a facet arthropathy which is not going to hold after partial facetectomy just to take out the disc, and I think this is what he needs to feel better.     PHYSICAL EXAMINATION :  He is alert oriented x4.  He answers all questions appropriately.  Memory, language, attention span, and fund of knowledge are normal.  Speech is clear; it is also  fluent.  He has a mildly antalgic gait favoring the left lower extremity.     PLAN :  I spoke with Mr. Bontempo and will try to get this done next week, Friday.

## 2022-05-23 DIAGNOSIS — M4317 Spondylolisthesis, lumbosacral region: Secondary | ICD-10-CM | POA: Diagnosis not present

## 2022-05-23 DIAGNOSIS — Z981 Arthrodesis status: Secondary | ICD-10-CM

## 2022-05-23 MED ORDER — OXYCODONE-ACETAMINOPHEN 5-325 MG PO TABS: 1.0000 | ORAL_TABLET | ORAL | 0 refills | Status: DC | PRN

## 2022-05-23 MED ORDER — METHOCARBAMOL 750 MG PO TABS: 750.0000 mg | ORAL_TABLET | Freq: Four times a day (QID) | ORAL | 0 refills | Status: DC

## 2022-05-23 NOTE — Care Management (Signed)
Patient with order to DC to home today. Unit staff to provide DME needed for home.   No HH needs identified   Patient will have family/ friends provide transportation home. No other TOC needs identified for DCPatient with order to DC to home today.

## 2022-05-23 NOTE — Evaluation (Signed)
Physical Therapy Evaluation Patient Details Name: Nicholas Bailey MRN: [id number] DOB: 1950/06/19 Today's Date: 05/23/2022  History of Present Illness  Pt is a 72 y/o M s/p PLIF L5-S1 on 2/9. No PMH on file.  Clinical Impression  Patient is s/p above surgery resulting in the deficits listed below (see PT Problem List).  Patient mobilizing well and will have assist at home. Pt feels ready for DC home today.   I have answered all patient's question regarding PT and mobility.  I have encouraged the patient to gradually increase activity daily to tolerance.      Recommendations for follow up therapy are one component of a multi-disciplinary discharge planning process, led by the attending physician.  Recommendations may be updated based on patient status, additional functional criteria and insurance authorization.  Follow Up Recommendations No PT follow up      Assistance Recommended at Discharge PRN  Patient can return home with the following       Equipment Recommendations Rolling walker (2 wheels)  Recommendations for Other Services       Functional Status Assessment       Precautions / Restrictions Precautions Precautions: Back Precaution Booklet Issued: Yes (comment) Precaution Comments: Pt able to recall 2/3 back precautions Required Braces or Orthoses:  (none) Restrictions Weight Bearing Restrictions: No      Mobility  Bed Mobility Overal bed mobility: Needs Assistance Bed Mobility: Sidelying to Sit Rolling: Supervision Sidelying to sit: Supervision       General bed mobility comments: pt able to demo log rolling    Transfers Overall transfer level: Needs assistance Equipment used: None Transfers: Sit to/from Stand Sit to Stand: Supervision           General transfer comment: no loss of balance with 2 attempts    Ambulation/Gait Ambulation/Gait assistance: Supervision Gait Distance (Feet): 100 Feet Assistive device: Rolling walker (2 wheels) Gait  Pattern/deviations: Step-through pattern       General Gait Details: no balance losses  Stairs Stairs: Yes Stairs assistance: Min guard Stair Management: One rail Left, Forwards, Step to pattern Number of Stairs: 10 General stair comments: no difficulties  Wheelchair Mobility    Modified Rankin (Stroke Patients Only)       Balance           Standing balance support: Reliant on assistive device for balance   Standing balance comment: pt reports feeling a little "Off" with his balance with previous attempts at standing but did ot feel that way during PT session.                             Pertinent Vitals/Pain Pain Assessment Pain Assessment: 0-10 Pain Score: 2  Pain Location: low back Pain Intervention(s): Monitored during session    Home Living Family/patient expects to be discharged to:: Private residence Living Arrangements: Spouse/significant other Available Help at Discharge: Family;Available 24 hours/day Type of Home: House Home Access: Level entry     Alternate Level Stairs-Number of Steps: flight Home Layout: Two level;Bed/bath upstairs Home Equipment: None      Prior Function Prior Level of Function : Independent/Modified Independent             Mobility Comments: no AD use ADLs Comments: ind     Hand Dominance        Extremity/Trunk Assessment   Upper Extremity Assessment Upper Extremity Assessment: Defer to OT evaluation    Lower Extremity Assessment Lower Extremity Assessment:  Overall [facility] for tasks assessed    Cervical / Trunk Assessment Cervical / Trunk Assessment: Back Surgery  Communication   Communication: No difficulties  Cognition Arousal/Alertness: Awake/alert Behavior During Therapy: WFL for tasks assessed/performed Overall Cognitive Status: Within Functional Limits for tasks assessed                                          General Comments General comments (skin integrity, edema,  etc.): Wife present throughout treatment.    Exercises     Assessment/Plan    PT Assessment Patient does not need any further PT services  PT Problem List         PT Treatment Interventions      PT Goals (Current goals can be found in the Care Plan section)  Acute Rehab PT Goals Patient Stated Goal: to go home this AM    Frequency       Co-evaluation               AM-PAC PT "6 Clicks" Mobility  Outcome Measure Help needed turning from your back to your side while in a flat bed without using bedrails?: None Help needed moving from lying on your back to sitting on the side of a flat bed without using bedrails?: None Help needed moving to and from a bed to a chair (including a wheelchair)?: A Little Help needed standing up from a chair using your arms (e.g., wheelchair or bedside chair)?: A Little Help needed to walk in hospital room?: A Little Help needed climbing 3-5 steps with a railing? : A Little 6 Click Score: 20    End of Session Equipment Utilized During Treatment: Gait belt Activity Tolerance: Patient tolerated treatment well Patient left: in bed;with call bell/phone within reach;with family/visitor present Nurse Communication: Other (comment) (Need for RW) PT Visit Diagnosis: Unsteadiness on feet (R26.81)    Time: [id number] PT Time Calculation (min) (ACUTE ONLY): 26 min   Charges:   PT Evaluation $PT Eval Low Complexity: 1 Low PT Treatments $Gait Training: 8-22 mins        Nicholas Bailey, [place]  Office [phone] 05/23/2022   Nicholas Bailey 05/23/2022, 10:17 AM

## 2022-05-23 NOTE — Discharge Instructions (Signed)
Wound Care °Leave incision open to air. °You may shower. °Do not scrub directly on incision.  °Do not put any creams, lotions, or ointments on incision. °Activity °Walk each and every day, increasing distance each day. °No lifting greater than 5 lbs.  Avoid bending, arching, and twisting. °No driving for 2 weeks; may ride as a passenger locally. ° °Diet °Resume your normal diet.  °Return to Work °Will be discussed at you follow up appointment. °Call Your Doctor If Any of These Occur °Redness, drainage, or swelling at the wound.  °Temperature greater than 101 degrees. °Severe pain not relieved by pain medication. °Incision starts to come apart. °Follow Up Appt °Call today for appointment in 4 weeks ([phone]) or for problems.  If you have any hardware placed in your spine, you will need an x-ray before your appointment. °

## 2022-05-23 NOTE — Evaluation (Signed)
Occupational Therapy Evaluation Patient Details Name: Nicholas Bailey MRN: [id number] DOB: 01/21/1951 Today's Date: 05/23/2022   History of Present Illness Pt is a 72 y/o M s/p PLIF L5-S1 on 2/9. No PMH on file.   Clinical Impression   Pt reports independence at baseline with ADLs and functional mobility, pt lives with spouse who can assist at d/c. Pt currently needing mod I -min A for ADLs, min guard for bed mobility, and min guard for transfers with/without RW. Pt educated on compensatory strategies for ADLs, bed mobility, and precautions, pt verbalized understanding and adheres to precautions throughout session. Pt presenting with impairments listed below, will follow acutely. Recommend d/c home with family assistance.     Recommendations for follow up therapy are one component of a multi-disciplinary discharge planning process, led by the attending physician.  Recommendations may be updated based on patient status, additional functional criteria and insurance authorization.   Follow Up Recommendations  No OT follow up     Assistance Recommended at Discharge Intermittent Supervision/Assistance  Patient can return home with the following A little help with walking and/or transfers;A little help with bathing/dressing/bathroom;Assistance with cooking/housework;Direct supervision/assist for medications management;Direct supervision/assist for financial management;Help with stairs or ramp for entrance    Functional Status Assessment  Patient has had a recent decline in their functional status and demonstrates the ability to make significant improvements in function in a reasonable and predictable amount of time.  Equipment Recommendations  BSC/3in1    Recommendations for Other Services PT consult     Precautions / Restrictions Precautions Precautions: Back Precaution Booklet Issued: Yes (comment) Precaution Comments: educated pt on 3/3 back precautions Required Braces or Orthoses:  (no  brace needed per MD) Restrictions Weight Bearing Restrictions: No      Mobility Bed Mobility Overal bed mobility: Needs Assistance Bed Mobility: Sidelying to Sit, Rolling, Sit to Sidelying Rolling: Min guard Sidelying to sit: Min guard     Sit to sidelying: Min guard General bed mobility comments: cues for log roll technique    Transfers Overall transfer level: Needs assistance Equipment used: 1 person hand held assist Transfers: Sit to/from Stand Sit to Stand: Min guard           General transfer comment: mild LOB, to L side, able to self correct      Balance Overall balance assessment: Needs assistance Sitting-balance support: Feet supported Sitting balance-Leahy Scale: Good     Standing balance support: During functional activity Standing balance-Leahy Scale: Fair Standing balance comment: LOB x1 to L side                           ADL either performed or assessed with clinical judgement   ADL Overall ADL's : Needs assistance/impaired Eating/Feeding: Modified independent   Grooming: Modified independent   Upper Body Bathing: Minimal assistance   Lower Body Bathing: Minimal assistance   Upper Body Dressing : Minimal assistance   Lower Body Dressing: Minimal assistance Lower Body Dressing Details (indicate cue type and reason): dressed upon arrival, pt able to simulate figure 4 Toilet Transfer: Min guard;Ambulation;Rolling walker (2 wheels);Regular Glass blower/designer Details (indicate cue type and reason): simulated with and without RW, pt reaching out for external support     Tub/ Shower Transfer: Min guard   Functional mobility during ADLs: Min guard       Vision   Vision Assessment?: No apparent visual deficits     Perception Perception Perception Tested?: No  Praxis Praxis Praxis tested?: Not tested    Pertinent Vitals/Pain Pain Assessment Pain Assessment: No/denies pain     Hand Dominance     Extremity/Trunk  Assessment Upper Extremity Assessment Upper Extremity Assessment: Overall WFL for tasks assessed   Lower Extremity Assessment Lower Extremity Assessment: Defer to PT evaluation   Cervical / Trunk Assessment Cervical / Trunk Assessment: Normal   Communication Communication Communication: No difficulties   Cognition Arousal/Alertness: Awake/alert Behavior During Therapy: WFL for tasks assessed/performed Overall Cognitive Status: Within Functional Limits for tasks assessed                                 General Comments: some decreased safety awareness/awareness of deficits     General Comments  VSS on RA    Exercises     Shoulder Instructions      Home Living Family/patient expects to be discharged to:: Private residence Living Arrangements: Spouse/significant other Available Help at Discharge: Family;Available 24 hours/day Type of Home: House Home Access: Level entry     Home Layout: Two level;Bed/bath upstairs     Bathroom Shower/Tub: Teacher, early years/pre: Standard     Home Equipment: None          Prior Functioning/Environment Prior Level of Function : Independent/Modified Independent             Mobility Comments: no AD use ADLs Comments: ind        OT Problem List: Decreased range of motion;Decreased activity tolerance;Impaired balance (sitting and/or standing)      OT Treatment/Interventions: Self-care/ADL training;Therapeutic exercise;Energy conservation;DME and/or AE instruction;Therapeutic activities;Patient/family education;Balance training    OT Goals(Current goals can be found in the care plan section) Acute Rehab OT Goals Patient Stated Goal: none stated OT Goal Formulation: With patient Time For Goal Achievement: 06/06/22 Potential to Achieve Goals: Good ADL Goals Pt Will Perform Upper Body Dressing: with modified independence;sitting Pt Will Perform Lower Body Dressing: with modified independence;sit  to/from stand;sitting/lateral leans Pt Will Transfer to Toilet: with modified independence;ambulating;regular height toilet Pt Will Perform Tub/Shower Transfer: Tub transfer;Shower transfer;with modified independence;ambulating;3 in 1  OT Frequency: Min 2X/week    Co-evaluation              AM-PAC OT "6 Clicks" Daily Activity     Outcome Measure Help from another person eating meals?: None Help from another person taking care of personal grooming?: None Help from another person toileting, which includes using toliet, bedpan, or urinal?: A Little Help from another person bathing (including washing, rinsing, drying)?: A Little Help from another person to put on and taking off regular upper body clothing?: A Little Help from another person to put on and taking off regular lower body clothing?: A Little 6 Click Score: 20   End of Session Nurse Communication: Mobility status;Other (comment) (DME needs)  Activity Tolerance: Patient tolerated treatment well Patient left: in bed;with call bell/phone within reach  OT Visit Diagnosis: Unsteadiness on feet (R26.81);Other abnormalities of gait and mobility (R26.89);Muscle weakness (generalized) (M62.81)                Time: [id number] OT Time Calculation (min): 24 min Charges:  OT General Charges $OT Visit: 1 Visit OT Evaluation $OT Eval Low Complexity: 1 Low OT Treatments $Self Care/Home Management : 8-22 mins  Renaye Rakers, OTD, OTR/L SecureChat Preferred Acute Rehab [phone]  Renaye Rakers [place] 05/23/2022, 8:51 AM

## 2022-05-23 NOTE — Care Management Obs Status (Signed)
[facility] NOTIFICATION   Patient Details  Name: Nicholas Bailey MRN: [id number] Date of Birth: 22-Jul-1950   Medicare Observation Status Notification Given:  Yes    Carles Collet, RN 05/23/2022, 8:31 AM

## 2022-05-23 NOTE — Discharge Summary (Signed)
Physician Discharge Summary  Patient ID: Nicholas Bailey MRN: [id number] DOB/AGE: 08/04/1950 72 y.o.  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 The patient tolerated the procedure well and was taken to the recovery room and then to the floor in stable condition. The hospital course was routine. There were no complications. The wound remained clean dry and intact. Pt had appropriate back soreness. No complaints of leg pain or new N/T/W. The patient remained afebrile with stable vital signs, and tolerated a regular diet. The patient continued to increase activities, and pain was well controlled with oral pain medications.   Consults: None  Significant Diagnostic Studies:  Results for orders placed or performed during the hospital encounter of 05/22/22  ABO/Rh  Result Value Ref Range   ABO/RH(D)      O POS Performed at [facility] [address], [place], [place] [address]     DG Lumbar Spine 2-3 Views  Result Date: 05/23/2022 CLINICAL DATA:  Initial evaluation for lumbar fusion. EXAM: LUMBAR SPINE - 2-3 VIEW COMPARISON:  None Available. FINDINGS: AP and oblique intraoperative fluoroscopic images of the lumbosacral junction are provided. First image demonstrates a surgical trocar overlying the left aspect of the L5 vertebral body. Second image demonstrates postoperative changes from prior left PLIF and interbody fusion at L5-S1. No visible complication. IMPRESSION: Intraoperative fluoroscopic images for localization of left PLIF and arthrodesis at L5-S1. Electronically Signed   By: Jeannine Boga M.D.   On: 05/23/2022 01:25   DG C-Arm 1-60 Min-No Report  Result Date: 05/22/2022 Fluoroscopy  was utilized by the requesting physician.  No radiographic interpretation.   DG C-Arm 1-60 Min-No Report  Result Date: 05/22/2022 Fluoroscopy was utilized by the requesting physician.  No radiographic interpretation.   DG C-Arm 1-60 Min-No Report  Result Date: 05/22/2022 Fluoroscopy was utilized by the requesting physician.  No radiographic interpretation.   MR LUMBAR SPINE WO CONTRAST  Result Date: 05/07/2022 CLINICAL DATA:  Provided history: Lumbar spondylosis. Additional history provided by the scanning technologist: Patient reports low back pain radiating to bilateral buttocks and both legs for 2 months. EXAM: MRI LUMBAR SPINE WITHOUT CONTRAST TECHNIQUE: Multiplanar, multisequence MR imaging of the lumbar spine was performed. No intravenous contrast was administered. COMPARISON:  Lumbar spine MRI 12/05/2020 (images available, report unavailable). FINDINGS: Segmentation: 5 lumbar vertebrae are assumed and the caudal most well-formed intervertebral disc space is designated L5-S1. Alignment: Mild levocurvature of the upper lumbar spine. Mild dextrocurvature of the lower lumbar spine. No significant spondylolisthesis. Vertebrae: No lumbar vertebral compression fracture. Edema within the posterior elements on the right at L4-L5 and L5-S1, and on the left at L5-S1, likely degenerative and related to facet arthrosis. Moderate degenerative endplate edema on the left at L5-S1. Multilevel degenerative plate irregularity with Schmorl nodes. Conus medullaris and cauda equina: Conus extends to the L1-L2 level. No signal abnormality identified within the visualized distal spinal cord. Paraspinal and other soft tissues: No acute finding within included portions of the abdomen/retroperitoneum. No paraspinal mass or collection. Disc levels: Unless otherwise stated, the level by level findings below have not significantly changed from the prior MRI of 12/05/2020. Multilevel disc degeneration, greatest at L3-L4 (moderate  at this level). T12-L1: Facet arthrosis. No significant disc herniation or stenosis. L1-L2: Facet  arthrosis.  No significant disc herniation or stenosis. L2-L3: Slight disc bulge. Facet arthrosis (greater on the right). No significant spinal canal stenosis. Moderate right neural foraminal narrowing. L3-L4: Disc bulge with endplate osteophytes. New superimposed small left center/subarticular disc extrusion with slight cranial migration (series 5, image 12) (series 13, image 35). Facet arthrosis. Ligamentum flavum hypertrophy on the left. The disc extrusion contributes to mild left subarticular stenosis, contacting and slightly posteriorly displacing the descending left L4 nerve root (series 13, image 35). No significant central canal stenosis. Mild relative bilateral neural foraminal narrowing. L4-L5: Disc bulge with endplate spurring. Facet arthrosis. Ligamentum flavum hypertrophy on the left. Minimal relative left subarticular narrowing (without nerve root impingement). Central canal patent. Mild to moderate left neural foraminal narrowing. L5-S1: Disc bulge asymmetric to the left. Endplate osteophytes along the left aspect of the disc space. New superimposed broad-based central/left subarticular disc extrusion. Facet arthrosis with ligamentum flavum hypertrophy, progressed. Bilateral facet joint effusions, new from the prior MRI. The disc extrusion contributes to left subarticular stenosis and encroaches upon the descending left S1 nerve root (series 13, image 48). It also results in mild relative narrowing of the central canal. Moderate left neural foraminal narrowing, progressed. IMPRESSION: 1. Comparison is made to the prior lumbar spine MRI of 12/05/2020. 2. Lumbar spondylosis, as outlined and with findings most notably as follows. 3. Progressive marrow edema within the posterior elements on the right at L4-L5 and L5-S1, and on the left at L5-S1, likely degenerative and related to facet arthrosis. Progressive  moderate degenerative endplate edema on the left at L5-S1. 4. At L5-S1, there is a disc bulge asymmetric to the left. Endplate osteophytes along the left aspect of the disc space. New superimposed broad-based central/left subarticular disc extrusion. Facet arthrosis with ligamentum flavum hypertrophy, progressed. Bilateral facet joint effusions, new from the prior MRI. The disc extrusion contributes to left subarticular stenosis and encroaches upon the descending left S1 nerve root. It also results in mild relative narrowing of the central canal. Moderate left neural foraminal narrowing, progressed. 5. At L3-L4, there is a disc bulge with endplate osteophytes. New superimposed small left center/subarticular disc extrusion with slight cranial migration. Facet arthrosis. Ligamentum flavum hypertrophy on the left. The disc extrusion contributes to mild left subarticular stenosis, contacting and slightly posteriorly displacing the descending left L4 nerve root. No significant central canal stenosis. Mild relative bilateral neural foraminal narrowing, unchanged. 6. Mild Levocurvature of the upper lumbar spine. 7. Mild dextrocurvature of the lower lumbar spine. Electronically Signed   By: Kellie Simmering D.O.   On: 05/07/2022 09:09    Antibiotics:  Anti-infectives (From admission, onward)    Start     Dose/Rate Route Frequency Ordered Stop   05/23/22 0600  ceFAZolin (ANCEF) IVPB 2g/100 mL premix        2 g 200 mL/hr over 30 Minutes Intravenous On call to O.R. 05/22/22 1431 05/22/22 1905       Discharge Exam: Blood pressure (!) 149/88, pulse 85, temperature 97.6 F (36.4 C), temperature source Oral, resp. rate 20, height 6' 4"$  (1.93 m), weight 80.7 kg, SpO2 100 %. Neurologic: Grossly normal Ambulating and voiding well incision cdi   Discharge Medications:   Allergies as of 05/23/2022   No Known Allergies      Medication List     STOP taking these medications    oxyCODONE 5 MG immediate release  tablet Commonly known as: Oxy IR/ROXICODONE       TAKE these medications  acetaminophen 650 MG CR tablet Commonly known as: TYLENOL Take 650 mg by mouth every 8 (eight) hours as needed for pain.   fenofibrate 160 MG tablet Take 160 mg by mouth at bedtime.   fexofenadine 180 MG tablet Commonly known as: ALLEGRA Take 180 mg by mouth daily as needed for allergies or rhinitis.   hydrOXYzine 25 MG tablet Commonly known as: ATARAX Take 25 mg by mouth at bedtime as needed (sleep).   methocarbamol 750 MG tablet Commonly known as: Robaxin-750 Take 1 tablet (750 mg total) by mouth 4 (four) times daily.   oxyCODONE-acetaminophen 5-325 MG tablet Commonly known as: Percocet Take 1 tablet by mouth every 4 (four) hours as needed for severe pain.   sildenafil 20 MG tablet Commonly known as: REVATIO Take 3-5 tablets one hour prior to intercourse   simvastatin 40 MG tablet Commonly known as: ZOCOR Take 40 mg by mouth at bedtime.   VITAMIN C PO Take 1 tablet by mouth 3 (three) times a week.   Xiaflex 0.9 MG Solr Generic drug: Collagenase Clostrid Histolyt Inject 0.29m into penile plaque 2 times, 1-3 days apart        Disposition: home   Final Dx: PLIF L5-S1  Discharge Instructions      Remove dressing in 72 hours   Complete by: As directed    Call MD for:  difficulty breathing, headache or visual disturbances   Complete by: As directed    Call MD for:  hives   Complete by: As directed    Call MD for:  persistant dizziness or light-headedness   Complete by: As directed    Call MD for:  persistant nausea and vomiting   Complete by: As directed    Call MD for:  redness, tenderness, or signs of infection (pain, swelling, redness, odor or green/yellow discharge around incision site)   Complete by: As directed    Call MD for:  severe uncontrolled pain   Complete by: As directed    Call MD for:  temperature >100.4   Complete by: As directed    Diet - low sodium heart  healthy   Complete by: As directed    Driving Restrictions   Complete by: As directed    No driving for 2 weeks, no riding in the car for 1 week   Increase activity slowly   Complete by: As directed    Lifting restrictions   Complete by: As directed    No lifting more than 8 lbs          Signed: KOcie CornfieldMeyran 05/23/2022, 5:01 AM

## 2022-05-23 NOTE — Plan of Care (Signed)
  Problem: Education: Goal: Ability to verbalize activity precautions or restrictions will improve Outcome: Completed/Met Goal: Knowledge of the prescribed therapeutic regimen will improve Outcome: Completed/Met Goal: Understanding of discharge needs will improve Outcome: Completed/Met   Problem: Activity: Goal: Ability to avoid complications of mobility impairment will improve Outcome: Completed/Met Goal: Ability to tolerate increased activity will improve Outcome: Completed/Met Goal: Will remain free from falls Outcome: Completed/Met   Problem: Bowel/Gastric: Goal: Gastrointestinal status for postoperative course will improve Outcome: Completed/Met   Problem: Clinical Measurements: Goal: Ability to maintain clinical measurements within normal limits will improve Outcome: Completed/Met Goal: Postoperative complications will be avoided or minimized Outcome: Completed/Met Goal: Diagnostic test results will improve Outcome: Completed/Met   Problem: Pain Management: Goal: Pain level will decrease Outcome: Completed/Met   Problem: Skin Integrity: Goal: Will show signs of wound healing Outcome: Completed/Met   Problem: Health Behavior/Discharge Planning: Goal: Identification of resources available to assist in meeting health care needs will improve Outcome: Completed/Met   Problem: Bladder/Genitourinary: Goal: Urinary functional status for postoperative course will improve Outcome: Completed/Met Patient void, ambulate,VSS, alert and oriented. Surgical site clean and dry. D/c instructions explain and given to the patient all questions answered. Pt. D/c home per order.

## 2022-05-23 NOTE — Care Management CC44 (Signed)
Condition Code 44 Documentation Completed  Patient Details  Name: Megh Pawlik MRN: [id number] Date of Birth: [birth year]   Condition Code 44 given:  Yes Patient signature on Condition Code 44 notice:  Yes Documentation of 2 MD's agreement:  Yes Code 44 added to claim:  Yes    Carles Collet, RN 05/23/2022, 8:31 AM

## 2022-05-24 NOTE — Anesthesia Postprocedure Evaluation (Signed)
Anesthesia Post Note  Patient: Nicholas Bailey  Procedure(s) Performed: Left Lumbar Five- Sacral One Posterior Lumbar Interbody Fusion and Arthrodesis (Back)     Patient location during evaluation: PACU Anesthesia Type: General Level of consciousness: awake and alert Pain management: pain level controlled Vital Signs Assessment: post-procedure vital signs reviewed and stable Respiratory status: spontaneous breathing, nonlabored ventilation, respiratory function stable and patient connected to nasal cannula oxygen Cardiovascular status: blood pressure returned to baseline and stable Postop Assessment: no apparent nausea or vomiting Anesthetic complications: no   No notable events documented.  Last Vitals:  Vitals:   05/23/22 0217 05/23/22 0824  BP: (!) 149/88 132/88  Pulse: 85 82  Resp: 20 20  Temp: 36.4 C 36.5 C  SpO2: 100% 100%    Last Pain:  Vitals:   05/23/22 0824  TempSrc: Oral  PainSc:                  Tiajuana Amass
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
## 2022-06-17 ENCOUNTER — Ambulatory Visit (HOSPITAL_BASED_OUTPATIENT_CLINIC_OR_DEPARTMENT_OTHER): Payer: Medicare HMO | Attending: Neurosurgery | Admitting: Physical Therapy

## 2022-06-17 ENCOUNTER — Encounter (HOSPITAL_BASED_OUTPATIENT_CLINIC_OR_DEPARTMENT_OTHER): Payer: Self-pay | Admitting: Physical Therapy

## 2022-06-17 DIAGNOSIS — M6281 Muscle weakness (generalized): Secondary | ICD-10-CM

## 2022-06-17 DIAGNOSIS — M5126 Other intervertebral disc displacement, lumbar region: Secondary | ICD-10-CM | POA: Diagnosis present

## 2022-06-17 DIAGNOSIS — M5459 Other low back pain: Secondary | ICD-10-CM

## 2022-07-02 ENCOUNTER — Encounter (HOSPITAL_BASED_OUTPATIENT_CLINIC_OR_DEPARTMENT_OTHER): Payer: Self-pay

## 2022-07-02 ENCOUNTER — Ambulatory Visit (HOSPITAL_BASED_OUTPATIENT_CLINIC_OR_DEPARTMENT_OTHER): Payer: Medicare HMO

## 2022-07-02 DIAGNOSIS — M5459 Other low back pain: Secondary | ICD-10-CM

## 2022-07-02 DIAGNOSIS — M5126 Other intervertebral disc displacement, lumbar region: Secondary | ICD-10-CM | POA: Diagnosis not present

## 2022-07-02 DIAGNOSIS — M6281 Muscle weakness (generalized): Secondary | ICD-10-CM
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
## 2022-07-14 ENCOUNTER — Encounter (HOSPITAL_BASED_OUTPATIENT_CLINIC_OR_DEPARTMENT_OTHER): Payer: Medicare HMO | Admitting: Physical Therapy

## 2022-07-20 ENCOUNTER — Encounter (HOSPITAL_BASED_OUTPATIENT_CLINIC_OR_DEPARTMENT_OTHER): Payer: Medicare HMO | Admitting: Physical Therapy

## 2022-07-27 ENCOUNTER — Ambulatory Visit (HOSPITAL_BASED_OUTPATIENT_CLINIC_OR_DEPARTMENT_OTHER): Payer: Medicare HMO | Admitting: Physical Therapy

## 2022-08-03 ENCOUNTER — Encounter (HOSPITAL_BASED_OUTPATIENT_CLINIC_OR_DEPARTMENT_OTHER): Payer: Medicare HMO | Admitting: Physical Therapy

## 2022-08-10 ENCOUNTER — Encounter (HOSPITAL_BASED_OUTPATIENT_CLINIC_OR_DEPARTMENT_OTHER): Payer: Medicare HMO | Admitting: Physical Therapy

## 2023-04-02 ENCOUNTER — Other Ambulatory Visit (INDEPENDENT_AMBULATORY_CARE_PROVIDER_SITE_OTHER): Payer: Self-pay

## 2023-04-02 ENCOUNTER — Ambulatory Visit: Payer: Medicare HMO | Admitting: Orthopaedic Surgery

## 2023-04-02 ENCOUNTER — Encounter: Payer: Self-pay | Admitting: Orthopaedic Surgery

## 2023-04-02 DIAGNOSIS — M25561 Pain in right knee: Secondary | ICD-10-CM | POA: Diagnosis not present

## 2023-04-02 DIAGNOSIS — G8929 Other chronic pain: Secondary | ICD-10-CM

## 2023-04-02 MED ORDER — METHYLPREDNISOLONE ACETATE 40 MG/ML IJ SUSP
40.0000 mg | INTRAMUSCULAR | Status: AC | PRN
  Administered 2023-04-02: 40 mg via INTRA_ARTICULAR

## 2023-04-02 MED ORDER — LIDOCAINE HCL 1 % IJ SOLN
2.0000 mL | INTRAMUSCULAR | Status: AC | PRN
  Administered 2023-04-02: 2 mL

## 2023-04-02 MED ORDER — BUPIVACAINE HCL 0.5 % IJ SOLN
2.0000 mL | INTRAMUSCULAR | Status: AC | PRN
  Administered 2023-04-02: 2 mL via INTRA_ARTICULAR

## 2023-04-02 NOTE — Progress Notes (Signed)
Office Visit Note   Patient: Nicholas Bailey           Date of Birth: 1950-12-29           MRN: [id number] Visit Date: 04/02/2023              Requested by: Nicholas Contras, MD [address] [place],  [place] [address] PCP: Nicholas Contras, MD   Assessment & Plan: Visit Diagnoses:  1. Chronic pain of right knee     Plan: Impression 72 year old gentleman with right knee osteoarthritis.  Cortisone injection performed.  He tolerated this well.  Will see him back as needed.  Follow-Up Instructions: No follow-ups on file.   Orders:  Orders Placed This Encounter  Procedures   XR KNEE 3 VIEW RIGHT   No orders of the defined types were placed in this encounter.     Procedures: Large Joint Inj: R knee on 04/02/2023 4:44 PM Indications: pain Details: 22 G needle  Arthrogram: No  Medications: 40 mg methylPREDNISolone acetate 40 MG/ML; 2 mL lidocaine 1 %; 2 mL bupivacaine 0.5 % Consent was given by the patient. Patient was prepped and draped in the usual sterile fashion.       Clinical Data: No additional findings.   Subjective: Chief Complaint  Patient presents with   Right Knee - Pain    HPI Nicholas Bailey is a 72 year old gentleman who I saw about 4 years ago for chronic right knee pain.  We did injections at that time and it really helped.  Recently he has been having knee pain again.  He would like to try another injection. Review of Systems  Constitutional: Negative.   HENT: Negative.    Eyes: Negative.   Respiratory: Negative.    Cardiovascular: Negative.   Gastrointestinal: Negative.   Endocrine: Negative.   Genitourinary: Negative.   Skin: Negative.   Allergic/Immunologic: Negative.   Neurological: Negative.   Hematological: Negative.   Psychiatric/Behavioral: Negative.    All other systems reviewed and are negative.    Objective: Vital Signs: There were no vitals taken for this visit.  Physical Exam Vitals and nursing note reviewed.   Constitutional:      Appearance: He is well-developed.  HENT:     Head: Normocephalic and atraumatic.  Eyes:     Pupils: Pupils are equal, round, and reactive to light.  Pulmonary:     Effort: Pulmonary effort is normal.  Abdominal:     Palpations: Abdomen is soft.  Musculoskeletal:        General: Normal range of motion.     Cervical back: Neck supple.  Skin:    General: Skin is warm.  Neurological:     Mental Status: He is alert and oriented to person, place, and time.  Psychiatric:        Behavior: Behavior normal.        Thought Content: Thought content normal.        Judgment: Judgment normal.     Ortho Exam Exam of the right knee shows fully healed surgical scar.  He is status post complete patellectomy.  No joint effusion. Specialty Comments:  No specialty comments available.  Imaging: XR KNEE 3 VIEW RIGHT Result Date: 04/02/2023 X-rays of the right knee show advanced lateral compartment degenerative joint disease and joint space narrowing.  He is status post patellectomy me    PMFS History: Patient Active Problem List   Diagnosis Date Noted   S/P lumbar fusion 05/23/2022   Lumbar stenosis 05/22/2022  Gout 08/06/2020   Unilateral primary osteoarthritis, right knee 06/15/2016   Past Medical History:  Diagnosis Date   Carpal tunnel syndrome    Hyperlipidemia     Family History  Problem Relation Age of Onset   Heart attack Father    Heart disease Brother    Alzheimer's disease Mother    Bladder Cancer Neg Hx    Prostate cancer Neg Hx    Testicular cancer Neg Hx    Kidney cancer Neg Hx     Past Surgical History:  Procedure Laterality Date   left shoulder surgery     right knee surgery     TONSILLECTOMY     Social History   Occupational History   Not on file  Tobacco Use   Smoking status: Some Days    Types: Cigarettes   Smokeless tobacco: Never  Substance and Sexual Activity   Alcohol use: Yes    Comment: occ   Drug use: Not on file    Sexual activity: Not on file

## 2023-05-20 ENCOUNTER — Ambulatory Visit (HOSPITAL_BASED_OUTPATIENT_CLINIC_OR_DEPARTMENT_OTHER): Payer: Medicare HMO | Attending: Neurosurgery | Admitting: Physical Therapy

## 2023-05-20 ENCOUNTER — Encounter (HOSPITAL_BASED_OUTPATIENT_CLINIC_OR_DEPARTMENT_OTHER): Payer: Self-pay | Admitting: Physical Therapy

## 2023-05-20 DIAGNOSIS — R2689 Other abnormalities of gait and mobility: Secondary | ICD-10-CM

## 2023-05-20 DIAGNOSIS — M5459 Other low back pain: Secondary | ICD-10-CM | POA: Diagnosis present

## 2023-05-20 DIAGNOSIS — M6281 Muscle weakness (generalized): Secondary | ICD-10-CM | POA: Diagnosis present
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
## 2023-05-25 ENCOUNTER — Encounter (HOSPITAL_BASED_OUTPATIENT_CLINIC_OR_DEPARTMENT_OTHER): Payer: Self-pay | Admitting: Physical Therapy

## 2023-05-25 ENCOUNTER — Ambulatory Visit (HOSPITAL_BASED_OUTPATIENT_CLINIC_OR_DEPARTMENT_OTHER): Payer: Medicare HMO | Admitting: Physical Therapy

## 2023-05-25 DIAGNOSIS — M6281 Muscle weakness (generalized): Secondary | ICD-10-CM

## 2023-05-25 DIAGNOSIS — M5459 Other low back pain: Secondary | ICD-10-CM

## 2023-05-25 DIAGNOSIS — R2689 Other abnormalities of gait and mobility: Secondary | ICD-10-CM
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
## 2023-06-17 ENCOUNTER — Encounter (HOSPITAL_BASED_OUTPATIENT_CLINIC_OR_DEPARTMENT_OTHER): Payer: Medicare HMO | Admitting: Physical Therapy

## 2023-06-24 ENCOUNTER — Encounter (HOSPITAL_BASED_OUTPATIENT_CLINIC_OR_DEPARTMENT_OTHER): Payer: Medicare HMO | Admitting: Physical Therapy

## 2023-07-01 ENCOUNTER — Encounter (HOSPITAL_BASED_OUTPATIENT_CLINIC_OR_DEPARTMENT_OTHER): Payer: Medicare HMO | Admitting: Physical Therapy

## 2023-07-08 ENCOUNTER — Encounter (HOSPITAL_BASED_OUTPATIENT_CLINIC_OR_DEPARTMENT_OTHER): Payer: Medicare HMO | Admitting: Physical Therapy

## 2023-07-15 ENCOUNTER — Encounter (HOSPITAL_BASED_OUTPATIENT_CLINIC_OR_DEPARTMENT_OTHER): Payer: Medicare HMO | Admitting: Physical Therapy

## 2023-07-22 ENCOUNTER — Encounter (HOSPITAL_BASED_OUTPATIENT_CLINIC_OR_DEPARTMENT_OTHER): Payer: Medicare HMO | Admitting: Physical Therapy

## 2024-02-14 ENCOUNTER — Encounter: Payer: Self-pay | Admitting: Radiology

## 2024-03-17 ENCOUNTER — Ambulatory Visit: Admitting: Orthopaedic Surgery

## 2024-03-17 ENCOUNTER — Encounter: Payer: Self-pay | Admitting: Orthopaedic Surgery

## 2024-03-17 ENCOUNTER — Other Ambulatory Visit (INDEPENDENT_AMBULATORY_CARE_PROVIDER_SITE_OTHER): Payer: Self-pay

## 2024-03-17 DIAGNOSIS — M25561 Pain in right knee: Secondary | ICD-10-CM

## 2024-03-17 DIAGNOSIS — G8929 Other chronic pain: Secondary | ICD-10-CM

## 2024-03-17 NOTE — Progress Notes (Signed)
 Office Visit Note   Patient: Nicholas Bailey           Date of Birth: 19-Nov-1950           MRN: [id number] Visit Date: 03/17/2024              Requested by: Shelda Atlas, MD [address] [place],  [place] [address] PCP: Shelda Atlas, MD   Assessment & Plan: Visit Diagnoses:  1. Chronic pain of right knee     Plan: 73 year old gentleman with right knee osteoarthritis flare.  We repeated cortisone injection today.  Tolerated this well.  Will see him back as needed.  Follow-Up Instructions: No follow-ups on file.   Orders:  Orders Placed This Encounter  Procedures   XR KNEE 3 VIEW RIGHT   No orders of the defined types were placed in this encounter.     Procedures: No procedures performed   Clinical Data: No additional findings.   Subjective: Chief Complaint  Patient presents with   Right Knee - Pain    HPI Patient is a 73 year old gentleman who follows up for right knee pain.  Had a cortisone injection about a year ago which lasted for 6 months.  Started having more pain with side movements.  Denies any mechanical symptoms. Review of Systems  Constitutional: Negative.   HENT: Negative.    Eyes: Negative.   Respiratory: Negative.    Cardiovascular: Negative.   Gastrointestinal: Negative.   Endocrine: Negative.   Genitourinary: Negative.   Skin: Negative.   Allergic/Immunologic: Negative.   Neurological: Negative.   Hematological: Negative.   Psychiatric/Behavioral: Negative.    All other systems reviewed and are negative.    Objective: Vital Signs: There were no vitals taken for this visit.  Physical Exam Vitals and nursing note reviewed.  Constitutional:      Appearance: He is well-developed.  HENT:     Head: Normocephalic and atraumatic.  Eyes:     Pupils: Pupils are equal, round, and reactive to light.  Pulmonary:     Effort: Pulmonary effort is normal.  Abdominal:     Palpations: Abdomen is soft.  Musculoskeletal:        General:  Normal range of motion.     Cervical back: Neck supple.  Skin:    General: Skin is warm.  Neurological:     Mental Status: He is alert and oriented to person, place, and time.  Psychiatric:        Behavior: Behavior normal.        Thought Content: Thought content normal.        Judgment: Judgment normal.     Ortho Exam Exam of the right knee shows no joint effusion.  Unchanged from prior visit. Specialty Comments:  No specialty comments available.  Imaging: XR KNEE 3 VIEW RIGHT Result Date: 03/17/2024 X-rays of the right knee show advanced degenerative changes of the lateral compartment.  Status post patellectomy me.    PMFS History: Patient Active Problem List   Diagnosis Date Noted   S/P lumbar fusion 05/23/2022   Lumbar stenosis 05/22/2022   Gout 08/06/2020   Unilateral primary osteoarthritis, right knee 06/15/2016   Past Medical History:  Diagnosis Date   Carpal tunnel syndrome    Hyperlipidemia     Family History  Problem Relation Age of Onset   Heart attack Father    Heart disease Brother    Alzheimer's disease Mother    Bladder Cancer Neg Hx    Prostate cancer Neg  Hx    Testicular cancer Neg Hx    Kidney cancer Neg Hx     Past Surgical History:  Procedure Laterality Date   left shoulder surgery     right knee surgery     TONSILLECTOMY     Social History   Occupational History   Not on file  Tobacco Use   Smoking status: Some Days    Types: Cigarettes   Smokeless tobacco: Never  Substance and Sexual Activity   Alcohol use: Yes    Comment: occ   Drug use: Not on file   Sexual activity: Not on file
